# Patient Record
Sex: Male | Born: 1970
Health system: Southern US, Community
[De-identification: ages and names within clinical notes are randomized; demographics above are authoritative.]

## PROBLEM LIST (undated history)

## (undated) DIAGNOSIS — M25512 Pain in left shoulder: Secondary | ICD-10-CM

## (undated) DIAGNOSIS — M19042 Primary osteoarthritis, left hand: Secondary | ICD-10-CM

## (undated) DIAGNOSIS — J302 Other seasonal allergic rhinitis: Secondary | ICD-10-CM

## (undated) DIAGNOSIS — M7041 Prepatellar bursitis, right knee: Secondary | ICD-10-CM

## (undated) DIAGNOSIS — M25511 Pain in right shoulder: Secondary | ICD-10-CM

## (undated) DIAGNOSIS — M19041 Primary osteoarthritis, right hand: Secondary | ICD-10-CM

## (undated) DIAGNOSIS — E785 Hyperlipidemia, unspecified: Secondary | ICD-10-CM

## (undated) DIAGNOSIS — R739 Hyperglycemia, unspecified: Secondary | ICD-10-CM

## (undated) HISTORY — DX: Hyperglycemia, unspecified: R73.9

## (undated) HISTORY — PX: HEAD & NECK SKIN LESION EXCISIONAL BIOPSY: SUR472

## (undated) HISTORY — DX: Pain in right shoulder: M25.511

## (undated) HISTORY — DX: Primary osteoarthritis, left hand: M19.042

## (undated) HISTORY — DX: Pain in left shoulder: M25.512

## (undated) HISTORY — DX: Other seasonal allergic rhinitis: J30.2

## (undated) HISTORY — DX: Hyperlipidemia, unspecified: E78.5

## (undated) HISTORY — DX: Primary osteoarthritis, right hand: M19.041

## (undated) HISTORY — DX: Prepatellar bursitis, right knee: M70.41

---

## 2003-12-14 ENCOUNTER — Emergency Department (HOSPITAL_COMMUNITY): Admission: EM | Admit: 2003-12-14 | Discharge: 2003-12-14 | Payer: Self-pay | Admitting: Emergency Medicine

## 2008-05-14 ENCOUNTER — Ambulatory Visit: Payer: Self-pay | Admitting: Family Medicine

## 2008-05-14 DIAGNOSIS — J019 Acute sinusitis, unspecified: Secondary | ICD-10-CM

## 2008-05-14 DIAGNOSIS — J309 Allergic rhinitis, unspecified: Secondary | ICD-10-CM | POA: Insufficient documentation

## 2008-08-31 ENCOUNTER — Ambulatory Visit: Payer: Self-pay | Admitting: Family Medicine

## 2008-09-07 ENCOUNTER — Telehealth: Payer: Self-pay | Admitting: Family Medicine

## 2008-10-08 ENCOUNTER — Ambulatory Visit: Payer: Self-pay | Admitting: Family Medicine

## 2008-10-08 DIAGNOSIS — J209 Acute bronchitis, unspecified: Secondary | ICD-10-CM | POA: Insufficient documentation

## 2008-10-22 ENCOUNTER — Telehealth: Payer: Self-pay | Admitting: Family Medicine

## 2009-04-08 ENCOUNTER — Ambulatory Visit: Payer: Self-pay | Admitting: Family Medicine

## 2009-04-12 ENCOUNTER — Telehealth: Payer: Self-pay | Admitting: Family Medicine

## 2009-06-04 ENCOUNTER — Encounter: Payer: Self-pay | Admitting: Family Medicine

## 2010-03-08 ENCOUNTER — Ambulatory Visit: Payer: Self-pay | Admitting: Family Medicine

## 2010-03-08 DIAGNOSIS — J301 Allergic rhinitis due to pollen: Secondary | ICD-10-CM | POA: Insufficient documentation

## 2010-07-26 NOTE — Assessment & Plan Note (Signed)
Summary: allergy flare   Vital Signs:  Patient profile:   40 year old male Height:      74.25 inches Weight:      245 pounds BMI:     31.36 O2 Sat:      97 % on Room air Temp:     98.1 degrees F oral Pulse rate:   74 / minute BP sitting:   143 / 85  (left arm) Cuff size:   large  Vitals Entered By: Payton Spark CMA (March 08, 2010 10:15 AM)  O2 Flow:  Room air CC: head and nasal congestion x 1+ weeks.   Primary Care Shon Indelicato:  Seymour Bars DO  CC:  head and nasal congestion x 1+ weeks.Marland Kitchen  History of Present Illness: Michael Callahan is a 40 year-old male with a 2 week history of a clear nasal discharge that has not improved with Claritin.  The patient reports that Mucinex has made him feel like his nose is drier but he still has drainage.  He denies a headache, coughing, sore throat, fevers, chills, nausea, vomitting, ear pain, dental pain. In addition he denies any sinus pain but has felt pressure and fullness in the sinuses.  He also reports aches in his neck that usually improve with aspirin in the mornings.  He denies any other arthalagas, aches or pains.    He believes these symptoms began on a trip to [place].  late last month after consuming a beverage that has caused similar symptoms in the past.   He leaves tommorrow for a trip to [place] until the end of the month.    Current Medications (verified): 1)  Nasonex 50 Mcg/act Susp (Mometasone Furoate) .... 2 Sprays Per Nostril Once Daily  Allergies (verified): 1)  ! Pcn  Past History:  Past Medical History: Reviewed history from 05/14/2008 and no changes required. seasonal allergies  Social History: Reviewed history from 05/14/2008 and no changes required. 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 Never smoked. 1 ETOH/ wk. Runs 3 mi 3 x a wk.  Review of Systems      See HPI  Physical Exam  General:  alert, well-developed, and well-nourished.   Head:   normocephalic and atraumatic.   Eyes:  conjunctiva clear Ears:  No ear pain on the tragus or pinna.  Canals are clear and tympanic membranes are without bulging or eryhtema. Nose:  Nasal mucosa and turbinates is erthyematous and irritated Mouth:  good dentition, no erythema, and no postnasal drip.   Neck:  supple.  No lymphadnopathy Lungs:  Clear to auscultation bilaterally with no rales, rhonchi, wheezes or crackles.  No egophany noted. Heart:  normal rate, regular rhythm, no murmur, no gallop, and no rub.   Skin:  color normal.   Psych:  good eye contact, not anxious appearing, and not depressed appearing.     Impression & Recommendations:  Problem # 1:  HAY FEVER (ICD-477.0) Allergy flare either from ingestion of beer or seaon  changes, not improved with OTC anti histamines. Treat with Depo Medrol 80 mg IM x 1 today and Astelin nasal spray for the next month. Call if not improved in 1 wk.    Complete Medication List: 1)  Azelastine Hcl 0.05 % Soln (Azelastine hcl) .Marland Kitchen.. 1-2 sprays per nostril bid  Other Orders: Depo- Medrol 80mg  (J1040) Admin of Therapeutic Inj  intramuscular or subcutaneous (09811)  Patient Instructions: 1)  Steroid shot today. 2)  Start on Astelin Nasal spray 2 x a day.  Use for the next month. 3)  Call if symptoms have not resolved in 1 wk. Prescriptions: AZELASTINE HCL 0.05 % SOLN (AZELASTINE HCL) 1-2 sprays per nostril bid  #1 bottle x 2   Entered and Authorized by:   Seymour Bars DO   Signed by:   Seymour Bars DO on 03/08/2010   Method used:   Electronically to        [facility]* (retail)       [address]       [place], [place]  [address]       Ph: [phone]       Fax: [phone]   RxID:   [id number]    Medication Administration  Injection # 1:    Medication: Depo- Medrol 80mg     Diagnosis: ALLERGIC RHINITIS (ICD-477.9)    Route: IM    Site: RUOQ gluteus    Exp Date: 07/2010    Lot #: [id number]    Patient  tolerated injection without complications    Given by: Payton Spark CMA (March 08, 2010 11:32 AM)  Orders Added: 1)  Depo- Medrol 80mg  [J1040] 2)  Admin of Therapeutic Inj  intramuscular or subcutaneous [96372] 3)  Est. Patient Level III [84132]

## 2010-11-14 ENCOUNTER — Telehealth: Payer: Self-pay | Admitting: Family Medicine

## 2010-11-14 NOTE — Telephone Encounter (Signed)
Patient called request to changed providers from [facility] to [facility] request to set up an appointment with Dr.Metheney advised patient he will have to wait to sched an appt.

## 2010-11-14 NOTE — Telephone Encounter (Signed)
We have to have the pt (not his wife) request a change in physicians.

## 2010-11-14 NOTE — Telephone Encounter (Signed)
Patient's wife called to schedule patient a appt., but needs to know if he can start seeing Dr. Linford Arnold since his wife sees Dr. Linford Arnold also?? He is currently a Dr. Cathey Endow patient but he would like to have the same pcp as his wife. Is it okay to schedule this patient with Dr.Metheney instead of Dr.Bowen??? Patient is waiting on a call back at [phone]

## 2010-11-15 NOTE — Telephone Encounter (Signed)
Fine

## 2013-03-24 ENCOUNTER — Ambulatory Visit (INDEPENDENT_AMBULATORY_CARE_PROVIDER_SITE_OTHER): Payer: BC Managed Care – PPO | Admitting: Family Medicine

## 2013-03-24 ENCOUNTER — Encounter: Payer: Self-pay | Admitting: Family Medicine

## 2013-03-24 ENCOUNTER — Ambulatory Visit: Payer: Self-pay | Admitting: Physician Assistant

## 2013-03-24 VITALS — BP 123/81 | HR 57 | Ht 74.0 in | Wt 248.0 lb

## 2013-03-24 DIAGNOSIS — J309 Allergic rhinitis, unspecified: Secondary | ICD-10-CM

## 2013-03-24 DIAGNOSIS — Z23 Encounter for immunization: Secondary | ICD-10-CM

## 2013-03-24 DIAGNOSIS — J302 Other seasonal allergic rhinitis: Secondary | ICD-10-CM

## 2013-03-24 DIAGNOSIS — Z130 Encounter for screening for diseases of the blood and blood-forming organs and certain disorders involving the immune mechanism: Secondary | ICD-10-CM

## 2013-03-24 DIAGNOSIS — Z131 Encounter for screening for diabetes mellitus: Secondary | ICD-10-CM

## 2013-03-24 DIAGNOSIS — Z13220 Encounter for screening for lipoid disorders: Secondary | ICD-10-CM

## 2013-03-24 HISTORY — DX: Other seasonal allergic rhinitis: J30.2

## 2013-03-24 NOTE — Progress Notes (Signed)
CC: Michael Callahan is a 42 y.o. male is here for Establish Care   Subjective: HPI:  Very pleasant 42 year old here to establish care  Patient reports history of seasonal allergies otherwise no medical complaints. He describes symptoms of stuffy nose watery eyes that has required Claritin-D on a daily basis up until 6 months ago when he been started a supplement called mean Green  Which was also completely taking care of of the above symptoms. He stopped this a few months ago currently denies any the above symptoms nor itching, cough, wheezing, shortness of breath, nor sinus pressure nor rash. He's wondering if he should restart the medication  Patient has decided to sign up for a half marathon he can run up to 3 miles without resting without worsening shortness of breath or any chest discomfort. He currently denies any weakness nor joint discomfort. Wants to know if it's safe for him to keep training  Review of Systems - General ROS: negative for - chills, fever, night sweats, weight gain or weight loss Ophthalmic ROS: negative for - decreased vision Psychological ROS: negative for - anxiety or depression ENT ROS: negative for - hearing change, nasal congestion, tinnitus or allergies Hematological and Lymphatic ROS: negative for - bleeding problems, bruising or swollen lymph nodes Breast ROS: negative Respiratory ROS: no cough, shortness of breath, or wheezing Cardiovascular ROS: no chest pain or dyspnea on exertion Gastrointestinal ROS: no abdominal pain, change in bowel habits, or black or bloody stools Genito-Urinary ROS: negative for - genital discharge, genital ulcers, incontinence or abnormal bleeding from genitals Musculoskeletal ROS: negative for - joint pain or muscle pain Neurological ROS: negative for - headaches or memory loss Dermatological ROS: negative for lumps, mole changes, rash and skin lesion changes  Past Medical History  Diagnosis Date  . Seasonal allergies 03/24/2013      Family History  Problem Relation Age of Onset  . Alcoholism Father     grandfather  . Brain cancer      uncle     History  Substance Use Topics  . Smoking status: Never Smoker   . Smokeless tobacco: Not on file  . Alcohol Use: Yes     Comment: 3 drinks per month     Objective: Filed Vitals:   03/24/13 1437  BP: 123/81  Pulse: 57    General: Alert and Oriented, No Acute Distress HEENT: Pupils equal, round, reactive to light. Conjunctivae clear.  External ears unremarkable, canals clear with intact TMs wi moist mucous membranes are unremarkable Lungs: Clear to auscultation bilaterally, no wheezing/ronchi/rales.  Comfortable work of breathing. Good air movement. Cardiac: Regular rate and rhythm. Normal S1/S2.  No murmurs, rubs, nor gallops. No carotid bruit Extremities: No peripheral edema.  Strong peripheral pulses.  Mental Status: No depression, anxiety, nor agitation. Skin: Warm and dry.  Assessment & Plan: Michael Callahan was seen today for establish care.  Diagnoses and associated orders for this visit:  Lipid screening - Lipid panel  Diabetes mellitus screening - BASIC METABOLIC PANEL WITH GFR  Screening for deficiency anemia - CBC  Seasonal allergies  Need for prophylactic vaccination and inoculation against influenza    Seasonal allergies: Controlled I've encouraged him not to start any medication or supplements that there is no current indication He is due for cholesterol screening and diabetic screening, we'll also screen for anemia since he is starting to do strenuous exercise routine  Receive flu shot today  Return in about 1 month (around 04/23/2013) for CPE.

## 2013-03-26 ENCOUNTER — Ambulatory Visit: Payer: Self-pay | Admitting: Family Medicine

## 2013-11-25 ENCOUNTER — Ambulatory Visit (INDEPENDENT_AMBULATORY_CARE_PROVIDER_SITE_OTHER): Payer: BC Managed Care – PPO | Admitting: Family Medicine

## 2013-11-25 ENCOUNTER — Encounter: Payer: Self-pay | Admitting: Family Medicine

## 2013-11-25 VITALS — BP 150/89 | HR 88 | Wt 247.0 lb

## 2013-11-25 DIAGNOSIS — R05 Cough: Secondary | ICD-10-CM

## 2013-11-25 DIAGNOSIS — R059 Cough, unspecified: Secondary | ICD-10-CM

## 2013-11-25 MED ORDER — HYDROCODONE-HOMATROPINE 5-1.5 MG/5ML PO SYRP: 5.0000 mL | ORAL_SOLUTION | Freq: Three times a day (TID) | ORAL | Status: DC | PRN

## 2013-11-25 MED ORDER — ALBUTEROL SULFATE (2.5 MG/3ML) 0.083% IN NEBU
2.5000 mg | INHALATION_SOLUTION | Freq: Once | RESPIRATORY_TRACT | Status: AC
  Administered 2013-11-25: 2.5 mg via RESPIRATORY_TRACT

## 2013-11-25 MED ORDER — BECLOMETHASONE DIPROPIONATE 80 MCG/ACT NA AERS: INHALATION_SPRAY | NASAL | Status: DC

## 2013-11-25 MED ORDER — PREDNISONE 20 MG PO TABS: ORAL_TABLET | ORAL | Status: AC

## 2013-11-25 NOTE — Progress Notes (Signed)
CC: Michael Callahan is a 43 y.o. male is here for cough x 1 month   Subjective: HPI:  Nonproductive cough that has been present for the past month on a daily basis present all hours of the day described as persistent and moderate to severe in severity. Slightly improved with Mucinex D, nothing else makes better or worse. Symptoms began with nasal congestion, fatigue, sore throat however all the above have resolved for the past 3 weeks except for lingering cough. No improvement with Claritin. Denies shortness of breath, wheezing, fevers, chills, blood in sputum, productive cough, chest pain, facial pressure nor sore throat   Review Of Systems Outlined In HPI  Past Medical History  Diagnosis Date  . Seasonal allergies 03/24/2013    No past surgical history on file. Family History  Problem Relation Age of Onset  . Alcoholism Father     grandfather  . Brain cancer      uncle    History   Social History  . Marital Status: Legally Separated    Spouse Name: N/A    Number of Children: N/A  . Years of Education: N/A   Occupational History  . Not on file.   Social History Main Topics  . Smoking status: Never Smoker   . Smokeless tobacco: Not on file  . Alcohol Use: Yes     Comment: 3 drinks per month  . Drug Use: No  . Sexual Activity: Yes    Partners: Female   Other Topics Concern  . Not on file   Social History Narrative  . No narrative on file     Objective: BP 150/89  Pulse 88  Wt 247 lb (112.038 kg)  SpO2 97%  General: Alert and Oriented, No Acute Distress HEENT: Pupils equal, round, reactive to light. Conjunctivae clear.  External ears unremarkable, canals clear with intact TMs with appropriate landmarks.  Middle ear appears open without effusion. Pink inferior turbinates.  Moist mucous membranes, pharynx without inflammation nor lesions.  Neck supple without palpable lymphadenopathy nor abnormal masses. Lungs: Comfortable work of breathing without rhonchi nor rales.  Wheezing is present only in the initial milliseconds of his cough and localized only in the upper lung fields.  Comfortable work of breathing. Good air movement. Cardiac: Regular rate and rhythm. Normal S1/S2.  No murmurs, rubs, nor gallops.   Mental Status: No depression, anxiety, nor agitation. Skin: Warm and dry.  Assessment & Plan: [name] was seen today for cough x 1 month.  Diagnoses and associated orders for this visit:  Cough - albuterol (PROVENTIL) (2.5 MG/3ML) 0.083% nebulizer solution 2.5 mg; Take 3 mLs (2.5 mg total) by nebulization once. - predniSONE (DELTASONE) 20 MG tablet; Three tabs at once daily for five days. - HYDROcodone-homatropine (HYCODAN) 5-1.5 MG/5ML syrup; Take 5 mLs by mouth every 8 (eight) hours as needed for cough.  Other Orders - Beclomethasone Dipropionate (QNASL) 80 MCG/ACT AERS; Two sprays each nostril daily.    Albuterol nebulizer was provided in the office to determine if there was any reactive airways disease component to his cough, albuterol did not make symptoms better or worse. I suspect that his cough is most likely due to postnasal drip therefore start prednisone taper, and begin qnasl, sample was provided. Hycodan only for sleep to help with cough  25 minutes spent face-to-face during visit today of which at least 50% was counseling or coordinating care regarding: 1. Cough       Return if symptoms worsen or fail to improve.

## 2014-02-09 ENCOUNTER — Encounter: Payer: Self-pay | Admitting: Family Medicine

## 2014-02-09 ENCOUNTER — Ambulatory Visit (INDEPENDENT_AMBULATORY_CARE_PROVIDER_SITE_OTHER): Payer: BC Managed Care – PPO | Admitting: Family Medicine

## 2014-02-09 VITALS — BP 128/78 | HR 76 | Temp 98.6°F | Wt 242.0 lb

## 2014-02-09 DIAGNOSIS — H60399 Other infective otitis externa, unspecified ear: Secondary | ICD-10-CM | POA: Diagnosis not present

## 2014-02-09 DIAGNOSIS — H6091 Unspecified otitis externa, right ear: Secondary | ICD-10-CM

## 2014-02-09 MED ORDER — NEOMYCIN-POLYMYXIN-HC 3.5-10000-1 OT SOLN: 3.0000 [drp] | Freq: Four times a day (QID) | OTIC | Status: DC

## 2014-02-09 NOTE — Progress Notes (Signed)
CC: Michael Callahan is a 43 y.o. male is here for right ear pain   Subjective: HPI:  Complains of right ear pain described only as pain, mild in severity, localized to the canal of the ear. He has been bothered by this for 2 weeks, it slightly improves when using Ciprodex, it's worse with relation of the ear or canal of the ear. He reports a pressure sensation within the ear but denies hearing loss, tinnitus, nor roaring in the ear. Denies motor or sensory disturbances. No other interventions as of yet. Denies fevers, chills, discharge from the ear, nasal congestion, dizziness or confusion   Review Of Systems Outlined In HPI  Past Medical History  Diagnosis Date  . Seasonal allergies 03/24/2013    No past surgical history on file. Family History  Problem Relation Age of Onset  . Alcoholism Father     grandfather  . Brain cancer      uncle    History   Social History  . Marital Status: Legally Separated    Spouse Name: N/A    Number of Children: N/A  . Years of Education: N/A   Occupational History  . Not on file.   Social History Main Topics  . Smoking status: Never Smoker   . Smokeless tobacco: Not on file  . Alcohol Use: Yes     Comment: 3 drinks per month  . Drug Use: No  . Sexual Activity: Yes    Partners: Female   Other Topics Concern  . Not on file   Social History Narrative  . No narrative on file     Objective: BP 128/78  Pulse 76  Temp(Src) 98.6 F (37 C) (Oral)  Wt 242 lb (109.77 kg)  General: Alert and Oriented, No Acute Distress HEENT: Pupils equal, round, reactive to light. Conjunctivae clear.  External ears unremarkable other than mild erythema and edema in the proximal right ear canal, intact TMs with appropriate landmarks.  Middle ear appears open without effusion. Pink inferior turbinates.  Moist mucous membranes, pharynx without inflammation nor lesions.  Neck supple without palpable lymphadenopathy nor abnormal masses. Neuro: Rinne and Weber  test normal Extremities: No peripheral edema.  Strong peripheral pulses.  Mental Status: No depression, anxiety, nor agitation. Skin: Warm and dry.  Assessment & Plan: Michael Callahan was seen today for right ear pain.  Diagnoses and associated orders for this visit:  Right otitis externa - neomycin-polymyxin-hydrocortisone (CORTISPORIN) otic solution; Place 3 drops into the right ear 4 (four) times daily. For one week.    Right otitis externa: Start Cortisporin stop Ciprodex, if no improvement after 3 days feel free to call me and I will call in prednisone for any component of eustachian tube dysfunction causing a pressure station in his ear   Return if symptoms worsen or fail to improve.

## 2014-07-20 ENCOUNTER — Ambulatory Visit (INDEPENDENT_AMBULATORY_CARE_PROVIDER_SITE_OTHER): Payer: BLUE CROSS/BLUE SHIELD | Admitting: Family Medicine

## 2014-07-20 ENCOUNTER — Encounter: Payer: Self-pay | Admitting: Family Medicine

## 2014-07-20 VITALS — BP 153/97 | HR 77 | Temp 97.6°F | Wt 251.0 lb

## 2014-07-20 DIAGNOSIS — A499 Bacterial infection, unspecified: Secondary | ICD-10-CM | POA: Diagnosis not present

## 2014-07-20 DIAGNOSIS — J329 Chronic sinusitis, unspecified: Secondary | ICD-10-CM | POA: Diagnosis not present

## 2014-07-20 DIAGNOSIS — B9689 Other specified bacterial agents as the cause of diseases classified elsewhere: Secondary | ICD-10-CM

## 2014-07-20 MED ORDER — HYDROCODONE-HOMATROPINE 5-1.5 MG/5ML PO SYRP: 5.0000 mL | ORAL_SOLUTION | Freq: Every evening | ORAL | Status: DC | PRN

## 2014-07-20 MED ORDER — DOXYCYCLINE HYCLATE 100 MG PO TABS: ORAL_TABLET | ORAL | Status: AC

## 2014-07-20 NOTE — Progress Notes (Signed)
CC: Michael Callahan is a 44 y.o. male is here for Nasal Congestion and Cough   Subjective: HPI:   nonproductive cough, nasal congestion, postnasal drip facial pressure in the forehead that has been present for a month that has been waxing and waning, weekly basis. He believes it's plateaued over the past 2-3 days. No benefit from Mucinex products. No other interventions as of yet. Joined by fatigue. Symptoms are worse when lying down at night, significantly interfering with sleep now. Denies fevers, chills, wheezing, chest pain, shortness of breath, nausea or vomiting   Review Of Systems Outlined In HPI  Past Medical History  Diagnosis Date  . Seasonal allergies 03/24/2013    No past surgical history on file. Family History  Problem Relation Age of Onset  . Alcoholism Father     grandfather  . Brain cancer      uncle    History   Social History  . Marital Status: Legally Separated    Spouse Name: N/A    Number of Children: N/A  . Years of Education: N/A   Occupational History  . Not on file.   Social History Main Topics  . Smoking status: Never Smoker   . Smokeless tobacco: Not on file  . Alcohol Use: Yes     Comment: 3 drinks per month  . Drug Use: No  . Sexual Activity:    Partners: Female   Other Topics Concern  . Not on file   Social History Narrative     Objective: BP 153/97 mmHg  Pulse 77  Temp(Src) 97.6 F (36.4 C) (Oral)  Wt 251 lb (113.853 kg)  General: Alert and Oriented, No Acute Distress HEENT: Pupils equal, round, reactive to light. Conjunctivae clear.  External ears unremarkable, canals clear with intact TMs with appropriate landmarks.  Middle ear appears open without effusion. Pink inferior turbinates.  Moist mucous membranes, pharynx without inflammation nor lesions however moderate cobblestoning and postnasal drip.  Neck supple without palpable lymphadenopathy nor abnormal masses. Lungs: Clear to auscultation bilaterally, no  wheezing/ronchi/rales.  Comfortable work of breathing. Good air movement. Extremities: No peripheral edema.  Strong peripheral pulses.  Mental Status: No depression, anxiety, nor agitation. Skin: Warm and dry.  Assessment & Plan: [name] was seen today for nasal congestion and cough.  Diagnoses and associated orders for this visit:  Bacterial sinusitis - doxycycline (VIBRA-TABS) 100 MG tablet; One by mouth twice a day for ten days. - HYDROcodone-homatropine (HYCODAN) 5-1.5 MG/5ML syrup; Take 5 mLs by mouth at bedtime as needed for cough.    Bacterial sinusitis: Start doxycycline consider nasal saline washes. Provided with Hycodan to help with sleep, sedation warning provided.   Return if symptoms worsen or fail to improve.

## 2014-08-17 ENCOUNTER — Ambulatory Visit (INDEPENDENT_AMBULATORY_CARE_PROVIDER_SITE_OTHER): Payer: BLUE CROSS/BLUE SHIELD | Admitting: Family Medicine

## 2014-08-17 ENCOUNTER — Encounter: Payer: Self-pay | Admitting: Family Medicine

## 2014-08-17 VITALS — BP 153/93 | HR 64 | Temp 97.8°F | Wt 246.0 lb

## 2014-08-17 DIAGNOSIS — B9689 Other specified bacterial agents as the cause of diseases classified elsewhere: Secondary | ICD-10-CM

## 2014-08-17 DIAGNOSIS — A499 Bacterial infection, unspecified: Secondary | ICD-10-CM

## 2014-08-17 DIAGNOSIS — J329 Chronic sinusitis, unspecified: Secondary | ICD-10-CM | POA: Diagnosis not present

## 2014-08-17 MED ORDER — LEVOFLOXACIN 500 MG PO TABS: 500.0000 mg | ORAL_TABLET | Freq: Every day | ORAL | Status: DC

## 2014-08-17 MED ORDER — PREDNISONE 20 MG PO TABS: ORAL_TABLET | ORAL | Status: AC

## 2014-08-17 NOTE — Progress Notes (Signed)
CC: Michael Callahan is a 44 y.o. male is here for Nasal Congestion and Cough   Subjective: HPI:  Facial pressure localized in the forehead described as constant pressure with thick nasal congestion and postnasal drip. Productive cough in the morning but dry for the rest of the day. No benefit from pseudoephedrine or Mucinex. Mild benefit from Nettie pot. Symptoms are persistent throughout the day and are moderate in severity. Other than above nothing seems to make better or worse. Similar to symptoms that he was expecting back in January which was improved to a mild degree on doxycycline only to worsen 3 days after stopping this medication. He reports body aches but denies fevers chills shortness of breath or chest discomfort. No motor or sensory disturbances other than that described above   Review Of Systems Outlined In HPI  Past Medical History  Diagnosis Date  . Seasonal allergies 03/24/2013    No past surgical history on file. Family History  Problem Relation Age of Onset  . Alcoholism Father     grandfather  . Brain cancer      uncle    History   Social History  . Marital Status: Legally Separated    Spouse Name: N/A  . Number of Children: N/A  . Years of Education: N/A   Occupational History  . Not on file.   Social History Main Topics  . Smoking status: Never Smoker   . Smokeless tobacco: Not on file  . Alcohol Use: Yes     Comment: 3 drinks per month  . Drug Use: No  . Sexual Activity:    Partners: Female   Other Topics Concern  . Not on file   Social History Narrative     Objective: BP 153/93 mmHg  Pulse 64  Temp(Src) 97.8 F (36.6 C) (Oral)  Wt 246 lb (111.585 kg)  General: Alert and Oriented, No Acute Distress HEENT: Pupils equal, round, reactive to light. Conjunctivae clear.  External ears unremarkable, canals clear with intact TMs with appropriate landmarks.  Middle ear appears open without effusion. Boggy erythematous inferior turbinates with  moderate mucoid discharge.  Moist mucous membranes, pharynx without inflammation nor lesions.  Neck supple without palpable lymphadenopathy nor abnormal masses. Lungs: Clear to auscultation bilaterally, no wheezing/ronchi/rales.  Comfortable work of breathing. Good air movement. Cardiac: Regular rate and rhythm. Normal S1/S2.  No murmurs, rubs, nor gallops.   Extremities: No peripheral edema.  Strong peripheral pulses.  Mental Status: No depression, anxiety, nor agitation. Skin: Warm and dry.  Assessment & Plan: Michael Callahan was seen today for nasal congestion and cough.  Diagnoses and all orders for this visit:  Bacterial sinusitis Orders: -     predniSONE (DELTASONE) 20 MG tablet; Three tabs at once daily for five days. -     levofloxacin (LEVAQUIN) 500 MG tablet; Take 1 tablet (500 mg total) by mouth daily.   Bacterial sinusitis: Start prednisone burst and Levaquin. If no better by Thursday please call me and the next step will be a CT scan of the sinuses.  Return if symptoms worsen or fail to improve.

## 2014-10-19 ENCOUNTER — Encounter: Payer: BLUE CROSS/BLUE SHIELD | Admitting: Family Medicine

## 2014-10-26 ENCOUNTER — Encounter: Payer: Self-pay | Admitting: Family Medicine

## 2014-10-26 ENCOUNTER — Ambulatory Visit (INDEPENDENT_AMBULATORY_CARE_PROVIDER_SITE_OTHER): Payer: BLUE CROSS/BLUE SHIELD | Admitting: Family Medicine

## 2014-10-26 VITALS — BP 141/85 | HR 84 | Ht 74.0 in | Wt 246.0 lb

## 2014-10-26 DIAGNOSIS — Z0000 Encounter for general adult medical examination without abnormal findings: Secondary | ICD-10-CM | POA: Diagnosis not present

## 2014-10-26 DIAGNOSIS — Z23 Encounter for immunization: Secondary | ICD-10-CM | POA: Diagnosis not present

## 2014-10-26 NOTE — Progress Notes (Signed)
CC: Michael Callahan is a 44 y.o. male is here for Annual Exam   Subjective: HPI:  Colonoscopy: No current indication Prostate: Discussed screening risks/beneifts with patient today, no current indication Influenza Vaccine: Up-to-date Pneumovax: No current indication Td/Tdap: Overdue will need booster today Zoster: (Start 46 yo)  Presents for complete physical exam with no acute complaints.   Review of Systems - General ROS: negative for - chills, fever, night sweats, weight gain or weight loss Ophthalmic ROS: negative for - decreased vision Psychological ROS: negative for - anxiety or depression ENT ROS: negative for - hearing change, nasal congestion, tinnitus or allergies Hematological and Lymphatic ROS: negative for - bleeding problems, bruising or swollen lymph nodes Breast ROS: negative Respiratory ROS: no cough, shortness of breath, or wheezing Cardiovascular ROS: no chest pain or dyspnea on exertion Gastrointestinal ROS: no abdominal pain, change in bowel habits, or black or bloody stools Genito-Urinary ROS: negative for - genital discharge, genital ulcers, incontinence or abnormal bleeding from genitals Musculoskeletal ROS: negative for - joint pain or muscle pain Neurological ROS: negative for - headaches or memory loss Dermatological ROS: negative for lumps, mole changes, rash and skin lesion changes  Past Medical History  Diagnosis Date  . Seasonal allergies 03/24/2013    No past surgical history on file. Family History  Problem Relation Age of Onset  . Alcoholism Father     grandfather  . Brain cancer      uncle    History   Social History  . Marital Status: Legally Separated    Spouse Name: N/A  . Number of Children: N/A  . Years of Education: N/A   Occupational History  . Not on file.   Social History Main Topics  . Smoking status: Never Smoker   . Smokeless tobacco: Not on file  . Alcohol Use: Yes     Comment: 3 drinks per month  . Drug Use: No  .  Sexual Activity:    Partners: Female   Other Topics Concern  . Not on file   Social History Narrative     Objective: BP 141/85 mmHg  Pulse 84  Ht  (1.88 m)  Wt 246 lb (111.585 kg)  BMI 31.57 kg/m2  General: No Acute Distress HEENT: Atraumatic, normocephalic, conjunctivae normal without scleral icterus.  No nasal discharge, hearing grossly intact, TMs with good landmarks bilaterally with no middle ear abnormalities, posterior pharynx clear without oral lesions. Neck: Supple, trachea midline, no cervical nor supraclavicular adenopathy. Pulmonary: Clear to auscultation bilaterally without wheezing, rhonchi, nor rales. Cardiac: Regular rate and rhythm.  No murmurs, rubs, nor gallops. No peripheral edema.  2+ peripheral pulses bilaterally. Abdomen: Bowel sounds normal.  No masses.  Non-tender without rebound.  Negative Murphy's sign. MSK: Grossly intact, no signs of weakness.  Full strength throughout upper and lower extremities.  Full ROM in upper and lower extremities.  No midline spinal tenderness. Neuro: Gait unremarkable, CN II-XII grossly intact.  C5-C6 Reflex 2/4 Bilaterally, L4 Reflex 2/4 Bilaterally.  Cerebellar function intact. Skin: No rashes. Psych: Alert and oriented to person/place/time.  Thought process normal. No anxiety/depression.  Assessment & Plan: Michael Callahan was seen today for annual exam.  Diagnoses and all orders for this visit:  Annual physical exam Orders: -     Lipid panel -     COMPLETE METABOLIC PANEL WITH GFR -     CBC -     Tdap vaccine greater than or equal to 7yo IM   Healthy lifestyle interventions  including but not limited to regular exercise, a healthy low fat diet, moderation of salt intake, the dangers of tobacco/alcohol/recreational drug use, nutrition supplementation, and accident avoidance were discussed with the patient and a handout was provided for future reference.  Discussed sodium or stretching to help with blood pressure. He tells me  that he's been enjoying large volumes of salted nuts and seeds over the weekend. It sounds that there is a lot of room for improvement. I've asked him to follow-up with me in one month to go over blood pressure changes. Discussed DASH diet and sodium restriction  Return in about 1 month (around 11/26/2014) for Blood Pressure Review.

## 2014-10-26 NOTE — Patient Instructions (Signed)
DASH Eating Plan °DASH stands for "Dietary Approaches to Stop Hypertension." The DASH eating plan is a healthy eating plan that has been shown to reduce high blood pressure (hypertension). Additional health benefits may include reducing the risk of type 2 diabetes mellitus, heart disease, and stroke. The DASH eating plan may also help with weight loss. °WHAT DO I NEED TO KNOW ABOUT THE DASH EATING PLAN? °For the DASH eating plan, you will follow these general guidelines: °· Choose foods with a percent daily value for sodium of less than 5% (as listed on the food label). °· Use salt-free seasonings or herbs instead of table salt or sea salt. °· Check with your health care provider or pharmacist before using salt substitutes. °· Eat lower-sodium products, often labeled as "lower sodium" or "no salt added." °· Eat fresh foods. °· Eat more vegetables, fruits, and low-fat dairy products. °· Choose whole grains. Look for the word "whole" as the first word in the ingredient list. °· Choose fish and skinless chicken or turkey more often than red meat. Limit fish, poultry, and meat to 6 oz (170 g) each day. °· Limit sweets, desserts, sugars, and sugary drinks. °· Choose heart-healthy fats. °· Limit cheese to 1 oz (28 g) per day. °· Eat more home-cooked food and less restaurant, buffet, and fast food. °· Limit fried foods. °· Cook foods using methods other than frying. °· Limit canned vegetables. If you do use them, rinse them well to decrease the sodium. °· When eating at a restaurant, ask that your food be prepared with less salt, or no salt if possible. °WHAT FOODS CAN I EAT? °Seek help from a dietitian for individual calorie needs. °Grains °Whole grain or whole wheat bread. Brown rice. Whole grain or whole wheat pasta. Quinoa, bulgur, and whole grain cereals. Low-sodium cereals. Corn or whole wheat flour tortillas. Whole grain cornbread. Whole grain crackers. Low-sodium crackers. °Vegetables °Fresh or frozen vegetables  (raw, steamed, roasted, or grilled). Low-sodium or reduced-sodium tomato and vegetable juices. Low-sodium or reduced-sodium tomato sauce and paste. Low-sodium or reduced-sodium canned vegetables.  °Fruits °All fresh, canned (in natural juice), or frozen fruits. °Meat and Other Protein Products °Ground beef (85% or leaner), grass-fed beef, or beef trimmed of fat. Skinless chicken or turkey. Ground chicken or turkey. Pork trimmed of fat. All fish and seafood. Eggs. Dried beans, peas, or lentils. Unsalted nuts and seeds. Unsalted canned beans. °Dairy °Low-fat dairy products, such as skim or 1% milk, 2% or reduced-fat cheeses, low-fat ricotta or cottage cheese, or plain low-fat yogurt. Low-sodium or reduced-sodium cheeses. °Fats and Oils °Tub margarines without trans fats. Light or reduced-fat mayonnaise and salad dressings (reduced sodium). Avocado. Safflower, olive, or canola oils. Natural peanut or almond butter. °Other °Unsalted popcorn and pretzels. °The items listed above may not be a complete list of recommended foods or beverages. Contact your dietitian for more options. °WHAT FOODS ARE NOT RECOMMENDED? °Grains °White bread. White pasta. White rice. Refined cornbread. Bagels and croissants. Crackers that contain trans fat. °Vegetables °Creamed or fried vegetables. Vegetables in a cheese sauce. Regular canned vegetables. Regular canned tomato sauce and paste. Regular tomato and vegetable juices. °Fruits °Dried fruits. Canned fruit in light or heavy syrup. Fruit juice. °Meat and Other Protein Products °Fatty cuts of meat. Ribs, chicken wings, bacon, sausage, bologna, salami, chitterlings, fatback, hot dogs, bratwurst, and packaged luncheon meats. Salted nuts and seeds. Canned beans with salt. °Dairy °Whole or 2% milk, cream, half-and-half, and cream cheese. Whole-fat or sweetened yogurt. Full-fat   cheeses or blue cheese. Nondairy creamers and whipped toppings. Processed cheese, cheese spreads, or cheese  curds. °Condiments °Onion and garlic salt, seasoned salt, table salt, and sea salt. Canned and packaged gravies. Worcestershire sauce. Tartar sauce. Barbecue sauce. Teriyaki sauce. Soy sauce, including reduced sodium. Steak sauce. Fish sauce. Oyster sauce. Cocktail sauce. Horseradish. Ketchup and mustard. Meat flavorings and tenderizers. Bouillon cubes. Hot sauce. Tabasco sauce. Marinades. Taco seasonings. Relishes. °Fats and Oils °Butter, stick margarine, lard, shortening, ghee, and bacon fat. Coconut, palm kernel, or palm oils. Regular salad dressings. °Other °Pickles and olives. Salted popcorn and pretzels. °The items listed above may not be a complete list of foods and beverages to avoid. Contact your dietitian for more information. °WHERE CAN I FIND MORE INFORMATION? °National Heart, Lung, and [employer] Institute: www.nhlbi.nih.gov/health/health-topics/topics/dash/ °Document Released: 06/01/2011 Document Revised: 10/27/2013 Document Reviewed: 04/16/2013 °ExitCare® Patient Information ©2015 [employer]. This information is not intended to replace advice given to you by your health care provider. Make sure you discuss any questions you have with your health care provider. ° °

## 2014-12-01 LAB — CBC
HCT: 44.4 % (ref 39.0–52.0)
HEMOGLOBIN: 15.6 g/dL (ref 13.0–17.0)
MCH: 33.3 pg (ref 26.0–34.0)
MCHC: 35.1 g/dL (ref 30.0–36.0)
MCV: 94.7 fL (ref 78.0–100.0)
MPV: 11.1 fL (ref 8.6–12.4)
Platelets: 228 10*3/uL (ref 150–400)
RBC: 4.69 MIL/uL (ref 4.22–5.81)
RDW: 13.3 % (ref 11.5–15.5)
WBC: 5.2 10*3/uL (ref 4.0–10.5)

## 2014-12-02 ENCOUNTER — Telehealth: Payer: Self-pay | Admitting: Family Medicine

## 2014-12-02 DIAGNOSIS — R739 Hyperglycemia, unspecified: Secondary | ICD-10-CM | POA: Insufficient documentation

## 2014-12-02 HISTORY — DX: Hyperglycemia, unspecified: R73.9

## 2014-12-02 LAB — HEMOGLOBIN A1C
Hgb A1c MFr Bld: 5.2 % (ref <5.7)
MEAN PLASMA GLUCOSE: 103 mg/dL (ref <117)

## 2014-12-02 LAB — COMPLETE METABOLIC PANEL WITH GFR
ALBUMIN: 4.4 g/dL (ref 3.5–5.2)
ALT: 17 U/L (ref 0–53)
AST: 18 U/L (ref 0–37)
Alkaline Phosphatase: 49 U/L (ref 39–117)
BUN: 14 mg/dL (ref 6–23)
CHLORIDE: 105 meq/L (ref 96–112)
CO2: 27 mEq/L (ref 19–32)
CREATININE: 0.96 mg/dL (ref 0.50–1.35)
Calcium: 9.3 mg/dL (ref 8.4–10.5)
GFR, Est Non African American: >89 mL/min
GLUCOSE: 102 mg/dL — AB (ref 70–99)
Potassium: 4.6 mEq/L (ref 3.5–5.3)
SODIUM: 140 meq/L (ref 135–145)
TOTAL PROTEIN: 6.7 g/dL (ref 6.0–8.3)
Total Bilirubin: 0.7 mg/dL (ref 0.2–1.2)

## 2014-12-02 LAB — LIPID PANEL
CHOL/HDL RATIO: 3.9 ratio
Cholesterol: 185 mg/dL (ref 0–200)
HDL: 47 mg/dL (ref >=40)
LDL CALC: 112 mg/dL — AB (ref 0–99)
Triglycerides: 129 mg/dL (ref <150)
VLDL: 26 mg/dL (ref 0–40)

## 2014-12-02 NOTE — Telephone Encounter (Signed)
Labs added on and left detailed message on vm

## 2014-12-02 NOTE — Telephone Encounter (Signed)
Sue Lushndrea, Will you please let patient know that his LDL cholesterol was mildly elevated but not to a degree that warrants cholesterol lowering medication given his lack of cardiac risk factors.  This may change as he gets older, This can be improved with engaging in 30-45 minutes of moderate exercise most days of the week.  Also his blood sugar was mildly elevated so I'd recommend he have an A1c checked, I'll print off a lab slip but can you also ask the lab if they can do this as an add-on?  Liver function, kidney function, and blood cell counts were all normal.  I've completed the physician section of his biometric form, ready for pickup and in your inbox.

## 2015-01-18 ENCOUNTER — Encounter: Payer: Self-pay | Admitting: Family Medicine

## 2015-01-18 ENCOUNTER — Ambulatory Visit (INDEPENDENT_AMBULATORY_CARE_PROVIDER_SITE_OTHER): Payer: BLUE CROSS/BLUE SHIELD | Admitting: Family Medicine

## 2015-01-18 VITALS — BP 146/80 | HR 80 | Wt 245.0 lb

## 2015-01-18 DIAGNOSIS — H60393 Other infective otitis externa, bilateral: Secondary | ICD-10-CM

## 2015-01-18 DIAGNOSIS — L237 Allergic contact dermatitis due to plants, except food: Secondary | ICD-10-CM

## 2015-01-18 MED ORDER — CIPROFLOXACIN-HYDROCORTISONE 0.2-1 % OT SUSP: 3.0000 [drp] | Freq: Two times a day (BID) | OTIC | Status: DC

## 2015-01-18 MED ORDER — PREDNISONE 20 MG PO TABS
ORAL_TABLET | ORAL | Status: DC
Start: 2015-01-18 — End: 2016-01-13

## 2015-01-18 NOTE — Patient Instructions (Signed)
Poison [employer] ivy is a inflammation of the skin (contact dermatitis) caused by touching the allergens on the leaves of the ivy plant following previous exposure to the plant. The rash usually appears 48 hours after exposure. The rash is usually bumps (papules) or blisters (vesicles) in a linear pattern. Depending on your own sensitivity, the rash may simply cause redness and itching, or it may also progress to blisters which may break open. These must be well cared for to prevent secondary bacterial (germ) infection, followed by scarring. Keep any open areas dry, clean, dressed, and covered with an antibacterial ointment if needed. The eyes may also get puffy. The puffiness is worst in the morning and gets better as the day progresses. This dermatitis usually heals without scarring, within 2 to 3 weeks without treatment. HOME CARE INSTRUCTIONS  Thoroughly wash with soap and water as soon as you have been exposed to poison ivy. You have about one half hour to remove the plant resin before it will cause the rash. This washing will destroy the oil or antigen on the skin that is causing, or will cause, the rash. Be sure to wash under your fingernails as any plant resin there will continue to spread the rash. Do not rub skin vigorously when washing affected area. Poison ivy cannot spread if no oil from the plant remains on your body. A rash that has progressed to weeping sores will not spread the rash unless you have not washed thoroughly. It is also important to wash any clothes you have been wearing as these may carry active allergens. The rash will return if you wear the unwashed clothing, even several days later. Avoidance of the plant in the future is the best measure. Poison ivy plant can be recognized by the number of leaves. Generally, poison ivy has three leaves with flowering branches on a single stem. Diphenhydramine may be purchased over the counter and used as needed for itching. Do not drive with  this medication if it makes you drowsy.Ask your caregiver about medication for children. SEEK MEDICAL CARE IF:  Open sores develop.  Redness spreads beyond area of rash.  You notice purulent (pus-like) discharge.  You have increased pain.  Other signs of infection develop (such as fever). Document Released: 06/09/2000 Document Revised: 09/04/2011 Document Reviewed: 11/20/2008 [facility] Patient Information 2015 [place], [place]. This information is not intended to replace advice given to you by your health care provider. Make sure you discuss any questions you have with your health care provider. Otitis Externa Otitis externa is a bacterial or fungal infection of the outer ear canal. This is the area from the eardrum to the outside of the ear. Otitis externa is sometimes called "swimmer's ear." CAUSES  Possible causes of infection include:  Swimming in dirty water.  Moisture remaining in the ear after swimming or bathing.  Mild injury (trauma) to the ear.  Objects stuck in the ear (foreign body).  Cuts or scrapes (abrasions) on the outside of the ear. SIGNS AND SYMPTOMS  The first symptom of infection is often itching in the ear canal. Later signs and symptoms may include swelling and redness of the ear canal, ear pain, and yellowish-white fluid (pus) coming from the ear. The ear pain may be worse when pulling on the earlobe. DIAGNOSIS  Your health care provider will perform a physical exam. A sample of fluid may be taken from the ear and examined for bacteria or fungi. TREATMENT  Antibiotic ear drops are often given for  10 to 14 days. Treatment may also include pain medicine or corticosteroids to reduce itching and swelling. HOME CARE INSTRUCTIONS   Apply antibiotic ear drops to the ear canal as prescribed by your health care provider.  Take medicines only as directed by your health care provider.  If you have diabetes, follow any additional treatment instructions from your  health care provider.  Keep all follow-up visits as directed by your health care provider. PREVENTION   Keep your ear dry. Use the corner of a towel to absorb water out of the ear canal after swimming or bathing.  Avoid scratching or putting objects inside your ear. This can damage the ear canal or remove the protective wax that lines the canal. This makes it easier for bacteria and fungi to grow.  Avoid swimming in lakes, polluted water, or poorly chlorinated pools.  You may use ear drops made of rubbing alcohol and vinegar after swimming. Combine equal parts of white vinegar and alcohol in a bottle. Put 3 or 4 drops into each ear after swimming. SEEK MEDICAL CARE IF:   You have a fever.  Your ear is still red, swollen, painful, or draining pus after 3 days.  Your redness, swelling, or pain gets worse.  You have a severe headache.  You have redness, swelling, pain, or tenderness in the area behind your ear. MAKE SURE YOU:   Understand these instructions.  Will watch your condition.  Will get help right away if you are not doing well or get worse. Document Released: 06/12/2005 Document Revised: 10/27/2013 Document Reviewed: 06/29/2011 [facility] Patient Information 2015 [place], [place]. This information is not intended to replace advice given to you by your health care provider. Make sure you discuss any questions you have with your health care provider.

## 2015-01-18 NOTE — Progress Notes (Signed)
   Subjective:    Patient ID: Michael Callahan, male    DOB: October 13, 1970, 44 y.o.   MRN: [id number]  HPI Poison [place] thinks that he came in contact with this about 4 days ago. its on the inside of his L arm bicep area, finger tips and inside of R thigh. He feels like the rash that's in the right inner thigh is actually spreading towards his crotch area.  Left ear pain x 2 weeks.  Does swim a lot. Constant pressure and sore. No drainage.  No fever, chills.   Review of Systems     Objective:   Physical Exam  Constitutional: He is oriented to person, place, and time. He appears well-developed and well-nourished.  HENT:  Head: Normocephalic and atraumatic.  Right Ear: External ear normal.  Left Ear: External ear normal.  Nose: Nose normal.  There is some white debris scattered over both inner ear canals. Tympanic membranes look clear with no fluid or retraction.  Eyes: Conjunctivae and EOM are normal. Pupils are equal, round, and reactive to light.  Neurological: He is alert and oriented to person, place, and time.  Skin: Skin is warm. Rash noted.  Erythematous raised linear straight, left inner arm and several on the right inner thigh and a pink erythematous nodule on the right middle finger on the dorsum of the distal finger.  Psychiatric: He has a normal mood and affect. His behavior is normal.          Assessment & Plan:  Poison [place] - okay to use over-the-counter topical calamine lotion for soothing as well as several baths. Will do an oral prednisone course for 10 days.  Otitis externa bilateral-the protein drops prescribed. Patient can call if they're too expensive. Avoid over cleaning and remitting to metastases wax as this does make you more prone to swimmer's ear.

## 2015-12-22 ENCOUNTER — Encounter: Payer: BLUE CROSS/BLUE SHIELD | Admitting: Family Medicine

## 2016-01-13 ENCOUNTER — Ambulatory Visit (INDEPENDENT_AMBULATORY_CARE_PROVIDER_SITE_OTHER): Payer: Managed Care, Other (non HMO) | Admitting: Family Medicine

## 2016-01-13 ENCOUNTER — Encounter: Payer: Self-pay | Admitting: Family Medicine

## 2016-01-13 VITALS — BP 130/82 | HR 66 | Wt 240.0 lb

## 2016-01-13 DIAGNOSIS — L570 Actinic keratosis: Secondary | ICD-10-CM

## 2016-01-13 DIAGNOSIS — R3589 Other polyuria: Secondary | ICD-10-CM

## 2016-01-13 DIAGNOSIS — R358 Other polyuria: Secondary | ICD-10-CM

## 2016-01-13 DIAGNOSIS — H60393 Other infective otitis externa, bilateral: Secondary | ICD-10-CM | POA: Diagnosis not present

## 2016-01-13 MED ORDER — NEOMYCIN-POLYMYXIN-HC 1 % OT SOLN: OTIC | Status: AC

## 2016-01-13 NOTE — Progress Notes (Signed)
CC: Michael Callahan is a 45 y.o. male is here for Ear Pain   Subjective: HPI:  The past week he's felt pain and itching in both ear canals. No benefit from leftover Cipro eardrops that he got last year. Symptoms got worse last night. He denies any hearing loss or drainage from either ear. He denies any upper respiratory complaints. No fevers chills or headache  For the past year he's noticed that he urinates more than the average individual. He will wake up anywhere between 2 and 4 times a night to urinate. When traveling he notices that he urinates every 1-2 hours while awake. She denies any dysuria or sensation of incomplete voiding.  He's had a spot on his forehead that he wants to know if I can treat with cryotherapy. He usually goes to his dermatologist but for convenience once enough he can have it done here. It's flaky, he can scratch it off but will return in a few weeks. It's painless. He denies skin changes elsewhere.   Review Of Systems Outlined In HPI  Past Medical History  Diagnosis Date  . Seasonal allergies 03/24/2013    No past surgical history on file. Family History  Problem Relation Age of Onset  . Alcoholism Father     grandfather  . Brain cancer      uncle    Social History   Social History  . Marital Status: Legally Separated    Spouse Name: N/A  . Number of Children: N/A  . Years of Education: N/A   Occupational History  . Not on file.   Social History Main Topics  . Smoking status: Never Smoker   . Smokeless tobacco: Not on file  . Alcohol Use: Yes     Comment: 3 drinks per month  . Drug Use: No  . Sexual Activity:    Partners: Female   Other Topics Concern  . Not on file   Social History Narrative     Objective: BP 130/82 mmHg  Pulse 66  Wt 240 lb (108.863 kg)  General: Alert and Oriented, No Acute Distress HEENT: Pupils equal, round, reactive to light. Conjunctivae clear.  External ears unremarkable, canals With mild erythema and edema  with intact TMs with appropriate landmarks.  Middle ear appears open without effusion. Pink inferior turbinates.  Moist mucous membranes, pharynx without inflammation nor lesions.  Neck supple without palpable lymphadenopathy nor abnormal masses. Lungs: Clear comfortable work of breathing Cardiac: Regular rate and rhythm. Extremities: No peripheral edema.  Strong peripheral pulses.  Mental Status: No depression, anxiety, nor agitation. Skin: Warm and dry. Scale appearing to be actinic keratosis on the right forehead  Assessment & Plan: Michael Callahan was seen today for ear pain.  Diagnoses and all orders for this visit:  Otitis, externa, infective, bilateral -     NEOMYCIN-POLYMYXIN-HYDROCORTISONE (CORTISPORIN) 1 % SOLN otic solution; Four drops in affected ear(s) three times a day, keep in ear(s) for five minutes. Total of ten days.  Polyuria -     PSA -     Hemoglobin A1c  Actinic keratoses   Start Cortisporin for bilateral swimmer's ear Polyuria: Checking a PSA and A1c if both are normal will treat with Flomax Actinic keratosis: Cryotherapy was performed.  Cryotherapy Procedure Note  Pre-operative Diagnosis: Actinic keratosis  Post-operative Diagnosis: Actinic keratosis  Locations: right forehead  Indications: premalignant  Anesthesia: none  Procedure Details  History of allergy to iodine: no. Pacemaker? no.  Patient informed of risks (permanent scarring, infection,  light or dark discoloration, bleeding, infection, weakness, numbness and recurrence of the lesion) and benefits of the procedure and verbal informed consent obtained.  The areas are treated with liquid nitrogen therapy, frozen until ice ball extended 2 mm beyond lesion, allowed to thaw, and treated again. The patient tolerated procedure well.  The patient was instructed on post-op care, warned that there may be blister formation, redness and pain. Recommend OTC analgesia as needed for  pain.  Condition: Stable  Complications: none.  Plan: 1. Instructed to keep the area dry and covered for 24-48h and clean thereafter. 2. Warning signs of infection were reviewed.   3. Recommended that the patient use OTC analgesics as needed for pain.  4. Return in PRN   Return if symptoms worsen or fail to improve. The time of wax

## 2016-01-14 ENCOUNTER — Telehealth: Payer: Self-pay | Admitting: Family Medicine

## 2016-01-14 LAB — PSA: PSA: 0.97 ng/mL (ref <=4.00)

## 2016-01-14 LAB — HEMOGLOBIN A1C
HEMOGLOBIN A1C: 5.2 % (ref <5.7)
Mean Plasma Glucose: 103 mg/dL

## 2016-01-14 MED ORDER — TAMSULOSIN HCL 0.4 MG PO CAPS: 0.4000 mg | ORAL_CAPSULE | Freq: Every day | ORAL | Status: DC

## 2016-01-14 NOTE — Telephone Encounter (Signed)
Will you please let patient know that his PSA test and blood sugar test were both normal and reassuring. I recommend he start on medication called Flomax that'll sent to his pharmacy. This will help relax his prostate which is probably causing some resistance when he tries to urinate resulting in incomplete emptying of his bladder.

## 2016-01-14 NOTE — Telephone Encounter (Signed)
Pt.notified

## 2016-01-24 ENCOUNTER — Telehealth: Payer: Self-pay

## 2016-01-24 MED ORDER — CEFDINIR 300 MG PO CAPS: 300.0000 mg | ORAL_CAPSULE | Freq: Two times a day (BID) | ORAL | 0 refills | Status: AC

## 2016-01-24 NOTE — Telephone Encounter (Signed)
How about trying a systemic antibiotic called cefdinir, I'll send it to the [facility] pharmacy.

## 2016-01-25 NOTE — Telephone Encounter (Signed)
Pt.notified

## 2016-03-21 ENCOUNTER — Encounter: Payer: Self-pay | Admitting: Sports Medicine

## 2016-03-21 ENCOUNTER — Ambulatory Visit (INDEPENDENT_AMBULATORY_CARE_PROVIDER_SITE_OTHER): Payer: Managed Care, Other (non HMO) | Admitting: Sports Medicine

## 2016-03-21 VITALS — BP 144/86 | HR 71 | Wt 244.0 lb

## 2016-03-21 DIAGNOSIS — M19042 Primary osteoarthritis, left hand: Secondary | ICD-10-CM

## 2016-03-21 DIAGNOSIS — M19041 Primary osteoarthritis, right hand: Secondary | ICD-10-CM

## 2016-03-21 DIAGNOSIS — Z23 Encounter for immunization: Secondary | ICD-10-CM | POA: Diagnosis not present

## 2016-03-21 HISTORY — DX: Primary osteoarthritis, right hand: M19.041

## 2016-03-21 MED ORDER — MELOXICAM 15 MG PO TABS: ORAL_TABLET | ORAL | 3 refills | Status: DC

## 2016-03-21 NOTE — Progress Notes (Signed)
   Subjective:    I'm seeing this patient as a consultation for:  Dr. Laren BoomSean Hommel  CC: Bilateral hand pain  HPI: For weeks this pleasant 45 year old male has had pain that he localizes bilaterally, left worse than right at the second MCP, worse with lifting, grasping. There is some stiffness in the mornings. Symptoms are moderate, persistent without radiation. No trauma, no constitutional symptoms.  Past medical history:  Negative.  See flowsheet/record as well for more information.  Surgical history: Negative.  See flowsheet/record as well for more information.  Family history: Negative.  See flowsheet/record as well for more information.  Social history: Negative.  See flowsheet/record as well for more information.  Allergies, and medications have been entered into the medical record, reviewed, and no changes needed.   Review of Systems: No headache, visual changes, nausea, vomiting, diarrhea, constipation, dizziness, abdominal pain, skin rash, fevers, chills, night sweats, weight loss, swollen lymph nodes, body aches, joint swelling, muscle aches, chest pain, shortness of breath, mood changes, visual or auditory hallucinations.   Objective:   General: Well Developed, well nourished, and in no acute distress.  Neuro/Psych: Alert and oriented x3, extra-ocular muscles intact, able to move all 4 extremities, sensation grossly intact. Skin: Warm and dry, no rashes noted.  Respiratory: Not using accessory muscles, speaking in full sentences, trachea midline.  Cardiovascular: Pulses palpable, no extremity edema. Abdomen: Does not appear distended. Hands: Tenderness to palpation left worse than right second MCP, worse with passive motion, neurovascularly intact distally. Minimal swelling.  Impression and Recommendations:   This case required medical decision making of moderate complexity.  Primary osteoarthritis of both hands Bilateral second metacarpophalangeal joint  osteoarthritis. Starting meloxicam, x-rays, formal hand therapy. Return to see me in one month, injection if no better.

## 2016-03-21 NOTE — Assessment & Plan Note (Signed)
Bilateral second metacarpophalangeal joint osteoarthritis. Starting meloxicam, x-rays, formal hand therapy. Return to see me in one month, injection if no better.

## 2016-09-04 ENCOUNTER — Ambulatory Visit (INDEPENDENT_AMBULATORY_CARE_PROVIDER_SITE_OTHER): Payer: BLUE CROSS/BLUE SHIELD | Admitting: Sports Medicine

## 2016-09-04 DIAGNOSIS — M7041 Prepatellar bursitis, right knee: Secondary | ICD-10-CM

## 2016-09-04 HISTORY — DX: Prepatellar bursitis, right knee: M70.41

## 2016-09-04 MED ORDER — MELOXICAM 15 MG PO TABS: ORAL_TABLET | ORAL | 3 refills | Status: DC

## 2016-09-04 NOTE — Progress Notes (Signed)
   Subjective:    I'm seeing this patient as a consultation for:  Dr. Laren BoomSean Hommel  CC: Right knee pain  HPI: In January this pleasant 46 year old male fell directly onto the anterior right knee, he only had a bit of swelling, no bruising, was able to bear weight. Unfortunately he is continued to have slight pain even though it is improving little by little. No mechanical symptoms, no swelling, able to run and jump without pain. Pain is localized directly over the anterior patella.  Past medical history:  Negative.  See flowsheet/record as well for more information.  Surgical history: Negative.  See flowsheet/record as well for more information.  Family history: Negative.  See flowsheet/record as well for more information.  Social history: Negative.  See flowsheet/record as well for more information.  Allergies, and medications have been entered into the medical record, reviewed, and no changes needed.   Review of Systems: No headache, visual changes, nausea, vomiting, diarrhea, constipation, dizziness, abdominal pain, skin rash, fevers, chills, night sweats, weight loss, swollen lymph nodes, body aches, joint swelling, muscle aches, chest pain, shortness of breath, mood changes, visual or auditory hallucinations.   Objective:   General: Well Developed, well nourished, and in no acute distress.  Neuro/Psych: Alert and oriented x3, extra-ocular muscles intact, able to move all 4 extremities, sensation grossly intact. Skin: Warm and dry, no rashes noted.  Respiratory: Not using accessory muscles, speaking in full sentences, trachea midline.  Cardiovascular: Pulses palpable, no extremity edema. Abdomen: Does not appear distended. Right Knee: Normal to inspection with no erythema or effusion or obvious bony abnormalities. Palpation normal with no warmth or joint line tenderness or patellar tenderness or condyle tenderness. There is only minimal tenderness directly over the anterior patella with  a palpable nodule consistent with a prepatellar bursa. ROM normal in flexion and extension and lower leg rotation. Ligaments with solid consistent endpoints including ACL, PCL, LCL, MCL. Anterior cruciate ligament stress testing is showing some laxity but is symmetric with the contralateral side. Negative Mcmurray's and provocative meniscal tests. Non painful patellar compression. Patellar and quadriceps tendons unremarkable. Hamstring and quadriceps strength is normal.  Impression and Recommendations:   This case required medical decision making of moderate complexity.  Prepatellar bursitis, right knee Traumatic prepatellar bursitis after a fall back in January, x-rays at an outside facility were negative for fracture. Exam today the benign with the exception of mild sensitivity over the anterior patella with a palpable somewhat decompressed prepatellar bursa. Meloxicam, reaction knee brace, and the tincture of time. If insufficient or incomplete relief in one month we will proceed with an MRI.

## 2016-09-04 NOTE — Assessment & Plan Note (Signed)
Traumatic prepatellar bursitis after a fall back in January, x-rays at an outside facility were negative for fracture. Exam today the benign with the exception of mild sensitivity over the anterior patella with a palpable somewhat decompressed prepatellar bursa. Meloxicam, reaction knee brace, and the tincture of time. If insufficient or incomplete relief in one month we will proceed with an MRI.

## 2016-10-02 ENCOUNTER — Ambulatory Visit: Payer: BLUE CROSS/BLUE SHIELD | Admitting: Sports Medicine

## 2016-12-05 ENCOUNTER — Ambulatory Visit (INDEPENDENT_AMBULATORY_CARE_PROVIDER_SITE_OTHER): Payer: BLUE CROSS/BLUE SHIELD | Admitting: Sports Medicine

## 2016-12-05 ENCOUNTER — Ambulatory Visit (INDEPENDENT_AMBULATORY_CARE_PROVIDER_SITE_OTHER): Payer: BLUE CROSS/BLUE SHIELD

## 2016-12-05 ENCOUNTER — Encounter: Payer: Self-pay | Admitting: Sports Medicine

## 2016-12-05 DIAGNOSIS — M25512 Pain in left shoulder: Secondary | ICD-10-CM

## 2016-12-05 DIAGNOSIS — G8929 Other chronic pain: Secondary | ICD-10-CM

## 2016-12-05 DIAGNOSIS — M25511 Pain in right shoulder: Secondary | ICD-10-CM

## 2016-12-05 HISTORY — DX: Pain in right shoulder: M25.511

## 2016-12-05 MED ORDER — MELOXICAM 15 MG PO TABS: ORAL_TABLET | ORAL | 3 refills | Status: DC

## 2016-12-05 NOTE — Progress Notes (Signed)
   Subjective:    I'm seeing this patient as a consultation for:  Dr. Laren BoomSean Hommel  CC: Left worse than right shoulder pain  HPI: This is a pleasant 46 year old male, for years now he's had bilateral shoulder pain, more recently he was trying to uproot a bush, and developed severe left shoulder pain over the deltoid, worse with overhead activities. He was worried that he tore his rotator cuff, pain is severe, persistent. Localized over the deltoid but no radiation past the elbow.  Past medical history:  Negative.  See flowsheet/record as well for more information.  Surgical history: Negative.  See flowsheet/record as well for more information.  Family history: Negative.  See flowsheet/record as well for more information.  Social history: Negative.  See flowsheet/record as well for more information.  Allergies, and medications have been entered into the medical record, reviewed, and no changes needed.   Review of Systems: No headache, visual changes, nausea, vomiting, diarrhea, constipation, dizziness, abdominal pain, skin rash, fevers, chills, night sweats, weight loss, swollen lymph nodes, body aches, joint swelling, muscle aches, chest pain, shortness of breath, mood changes, visual or auditory hallucinations.   Objective:   General: Well Developed, well nourished, and in no acute distress.  Neuro/Psych: Alert and oriented x3, extra-ocular muscles intact, able to move all 4 extremities, sensation grossly intact. Skin: Warm and dry, no rashes noted.  Respiratory: Not using accessory muscles, speaking in full sentences, trachea midline.  Cardiovascular: Pulses palpable, no extremity edema. Abdomen: Does not appear distended. Left Shoulder: Inspection reveals no abnormalities, atrophy or asymmetry. No tenderness over the bicipital groove. Moderate pain over the acromioclavicular joint ROM is full in all planes. Rotator cuff strength normal throughout. Positive Neer and Hawkin's tests,  empty can. Speeds and Yergason's tests normal. No labral pathology noted with negative Obrien's, negative crank, negative clunk, and good stability. Normal scapular function observed. No painful arc and no drop arm sign. No apprehension sign  Procedure: Real-time Ultrasound Guided Injection of left subacromial bursa Device: GE Logiq E  Verbal informed consent obtained.  Time-out conducted.  Noted no overlying erythema, induration, or other signs of local infection.  Skin prepped in a sterile fashion.  Local anesthesia: Topical Ethyl chloride.  With sterile technique and under real time ultrasound guidance:  Noted intact rotator cuff, possibly a small partial with, partial-thickness articular sided insertional tear, 1 mL kenalog 40, 1 mL lidocaine, 1 mL bupivacaine injected easily into the subacromial bursa. Completed without difficulty  Pain immediately resolved suggesting accurate placement of the medication.  Advised to call if fevers/chills, erythema, induration, drainage, or persistent bleeding.  Images permanently stored and available for review in the ultrasound unit.  Impression: Technically successful ultrasound guided injection.  Impression and Recommendations:   This case required medical decision making of moderate complexity.  Bilateral shoulder pain Left worse than right shoulder pain. Impingement symptoms bilaterally. Left side is waking him from sleep, injected today. Formal physical therapy, bilateral x-rays, return in one month. Meloxicam.

## 2016-12-05 NOTE — Assessment & Plan Note (Signed)
Left worse than right shoulder pain. Impingement symptoms bilaterally. Left side is waking him from sleep, injected today. Formal physical therapy, bilateral x-rays, return in one month. Meloxicam.

## 2016-12-25 ENCOUNTER — Ambulatory Visit (INDEPENDENT_AMBULATORY_CARE_PROVIDER_SITE_OTHER): Payer: BLUE CROSS/BLUE SHIELD | Admitting: Rehabilitative and Restorative Service Providers"

## 2016-12-25 DIAGNOSIS — R29898 Other symptoms and signs involving the musculoskeletal system: Secondary | ICD-10-CM | POA: Diagnosis not present

## 2016-12-25 DIAGNOSIS — M25512 Pain in left shoulder: Secondary | ICD-10-CM

## 2016-12-25 DIAGNOSIS — M25511 Pain in right shoulder: Secondary | ICD-10-CM | POA: Diagnosis not present

## 2016-12-25 DIAGNOSIS — R293 Abnormal posture: Secondary | ICD-10-CM | POA: Diagnosis not present

## 2016-12-25 DIAGNOSIS — G8929 Other chronic pain: Secondary | ICD-10-CM | POA: Diagnosis not present

## 2016-12-25 NOTE — Therapy (Signed)
[facility] [address] [place], [place], [address] Phone: [phone]   Fax:  [phone]  Physical Therapy Evaluation  Patient Details  Name: Michael Callahan MRN: [id number] Date of Birth: 1970-09-07 Referring Provider: Dr Benjamin Stainhekkekandam   Encounter Date: 12/25/2016      PT End of Session - 12/25/16 1403    Visit Number 1   Number of Visits 12   Date for PT Re-Evaluation 02/05/17   PT Start Time 1403   PT Stop Time 1500   PT Time Calculation (min) 57 min   Activity Tolerance Patient tolerated treatment well      Past Medical History:  Diagnosis Date  . Seasonal allergies 03/24/2013    No past surgical history on file.  There were no vitals filed for this visit.       Subjective Assessment - 12/25/16 1410    Subjective Patient reports that he has bilat shoulder pain Lt worse than Rt. MD diagnosed shoulders with arthritis and "slight tear" in the Rt shoulder. He has had pain for several months with no known injury.    Pertinent History denies any musculoskeletal problems    How long can you sit comfortably? no limit   How long can you stand comfortably? no limit   How long can you walk comfortably? 3 miles    Diagnostic tests xrays    Patient Stated Goals build strength in Lt arm to pick things up and use the Lt arm    Currently in Pain? Yes   Pain Score 3    Pain Location Shoulder   Pain Orientation Left   Pain Descriptors / Indicators Throbbing   Pain Type Chronic pain   Pain Onset More than a month ago   Pain Frequency Constant   Aggravating Factors  walking > 3 miles; sleeping; lying on side; reaching; lifting; shoulder exercises; swimming free style    Pain Relieving Factors meds; ice   Multiple Pain Sites Yes   Pain Score 4   Pain Location Shoulder   Pain Orientation Right   Pain Descriptors / Indicators Throbbing   Pain Type Chronic pain   Pain Onset More than a month ago   Pain Frequency  Constant   Aggravating Factors  as above    Pain Relieving Factors as above             [facility] PT Assessment - 12/25/16 0001      Assessment   Medical Diagnosis Bilat shoudler pain    Referring Provider Dr Benjamin Stainhekkekandam    Onset Date/Surgical Date 09/24/16   Hand Dominance Right   Next MD Visit 01/04/17   Prior Therapy none     Precautions   Precautions None     Balance Screen   Has the patient fallen in the past 6 months No   Has the patient had a decrease in activity level because of a fear of falling?  No   Is the patient reluctant to leave their home because of a fear of falling?  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    Leisure swimming; sports shooting; cleaning pool      Observation/Other Assessments   Focus on Therapeutic Outcomes (FOTO)  20% limitation      Sensation   Additional Comments WFL's per pt report      Posture/Postural Control  Posture Comments head forward; shoulders rounded and elevated; scapulae abducted and rotated along the thoracic wall; increased thoracic kyphosis; arms in some IR at sides; sits with forward posture shoudlers rounded      AROM   Right/Left Shoulder --  tight end ranges bilat shoulders at 160-165 deg flex/abd      Strength   Right/Left Shoulder --  5/5 biulat shds except mid/lower traps 4/5 wi/discomfort Lt     Palpation   Palpation comment muscular tightness bialt pecs; traps; leveator; teres; lats Lt > Rt      Special Tests    Special Tests --  scapular dyskinesis bilat             Objective measurements completed on examination: See above findings.          [facility] Adult PT Treatment/Exercise - 12/25/16 0001      Self-Care   Self-Care --  sitting posture with noodle; sleeping positions      Therapeutic Activites    Therapeutic Activities --  myofacial ball release work      Neuro Re-ed    Neuro  Re-ed Details  working on posture and alignment engagning posterior shoulder girdle      Shoulder Exercises: Supine   Other Supine Exercises prolonged snow angel 2-3 min arms at ~80 deg      Shoulder Exercises: Standing   Retraction Limitations W's x 10    Other Standing Exercises axial extension 10 sec x 5 with noodle    Other Standing Exercises scap squeeze with noodle 10 sec x 10      Shoulder Exercises: Stretch   Corner Stretch --  3 way doorway 30 sec x 3 each    Other Shoulder Stretches biceps stretch 30 sec x 2 each UE      Cryotherapy   Number Minutes Cryotherapy 20 Minutes   Cryotherapy Location Shoulder  bilat    Type of Cryotherapy Ice pack     Electrical Stimulation   Electrical Stimulation Location bilat ant shoulders    Electrical Stimulation Action premod   Electrical Stimulation Parameters to tolerance   Electrical Stimulation Goals Tone;Pain                PT Education - 12/25/16 1503    Education provided Yes   Education Details HEP TENS DN    Person(s) Educated Patient   Methods Explanation;Demonstration;Tactile cues;Verbal cues;Handout   Comprehension Verbalized understanding;Returned demonstration;Verbal cues required;Tactile cues required             PT Long Term Goals - 12/25/16 1703      PT LONG TERM GOAL #1   Title Improve posture and alignment with patient to demonstrate good activation of posterior shoudler girdle musculature 02/05/17   Time 6   Period Weeks   Status New     PT LONG TERM GOAL #2   Title Improve tissue extensibility through anterior chest; strengthen middle and lower traps to 5/5 POC 02/05/17   Time 6   Period Weeks   Status New     PT LONG TERM GOAL #3   Title Patient reports 75-80% improvement in pain with functional activities involving bilat UE's 02/05/17   Time 6   Period Weeks   Status New     PT LONG TERM GOAL #4   Title Independent in HEP 02/05/17   Time 6   Period Weeks   Status New     PT  LONG TERM GOAL #5   Title  Improve FOTO to </= 19% limitation 02/05/17   Time 6   Period Weeks   Status New                Plan - 12/25/16 1659    Clinical Impression Statement Pink presents with bilat chronic shoulder pain with poor posture and alignment; scapular dyskinesis; end range decreased shoulder ROM; muscular imbalance; tightness through pecs and traps; weakness through middle and lower traps; abnormal movement patterns; pain on a daliy basis. Patient will benefit from PT to address problems identified.    Clinical Presentation Evolving   Clinical Decision Making Low   Rehab Potential Good   PT Frequency 2x / week   PT Duration 6 weeks   PT Treatment/Interventions Patient/family education;ADLs/Self Care Home Management;Cryotherapy;Electrical Stimulation;Iontophoresis 4mg /ml Dexamethasone;Moist Heat;Ultrasound;Dry needling;Manual techniques;Therapeutic activities;Therapeutic exercise;Neuromuscular re-education   PT Next Visit Plan review exercise; continue neuromuscular re-education; stretch pecs; strengthen posterior shoudler girdle; DN v manual work Gaffer; Scientific laboratory technician as indicated    Financial planner with Plan of Care Patient      Patient will benefit from skilled therapeutic intervention in order to improve the following deficits and impairments:  Postural dysfunction, Improper body mechanics, Pain, Impaired UE functional use, Decreased range of motion, Decreased mobility, Decreased strength, Decreased activity tolerance  Visit Diagnosis: Chronic right shoulder pain - Plan: PT plan of care cert/re-cert  Chronic left shoulder pain - Plan: PT plan of care cert/re-cert  Other symptoms and signs involving the musculoskeletal system - Plan: PT plan of care cert/re-cert  Abnormal posture - Plan: PT plan of care cert/re-cert     Problem List Patient Active Problem List   Diagnosis Date Noted  . Bilateral shoulder pain 12/05/2016  . Prepatellar bursitis, right knee  09/04/2016  . Primary osteoarthritis of both hands 03/21/2016  . Hyperglycemia 12/02/2014  . Seasonal allergies 03/24/2013  . 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 ALLERGIC RHINITIS 05/14/2008    Celyn Rober Minion PT, MPH  12/25/2016, 5:41 PM  [facility] [address] [place], [place], [address] Phone: [phone]   Fax:  [phone]  Name: Michael Callahan MRN: [id number] Date of Birth: 16-Jun-1971

## 2016-12-25 NOTE — Patient Instructions (Addendum)
Axial Extension (Chin Tuck)    Pull chin in and lengthen back of neck. Hold __10-15__ seconds while counting out loud. Repeat __3-5__ times. Do _several ___ sessions per day.     Shoulder Blade Squeeze   Noodle along the back.  Rotate shoulders back, then squeeze shoulder blades down and back. Hold 10 sec Repeat _10___ times. Do _several ___ sessions per day.  Scapula Adduction With Pectoralis Stretch: Low - Standing   Shoulders at 45 hands even with shoulders, keeping weight through legs, shift weight forward until you feel pull or stretch through the front of your chest. Hold _30__ seconds. Do _3__ times, _2-4__ times per day.   Scapula Adduction With Pectoralis Stretch: Mid-Range - Standing   Shoulders at 90 elbows even with shoulders, keeping weight through legs, shift weight forward until you feel pull or strength through the front of your chest. Hold __30_ seconds. Do _3__ times, __2-4_ times per day.   Scapula Adduction With Pectoralis Stretch: High - Standing   Shoulders at 120 hands up high on the doorway, keeping weight on feet, shift weight forward until you feel pull or stretch through the front of your chest. Hold _30__ seconds. Do _3__ times, _2-3__ times per day.  ELBOW: Biceps - Standing    Standing in doorway, place one hand on wall, elbow straight. Lean forward. Hold __30_ seconds. _3__ reps per set several per day   Scapular Retraction: Elbow Flexion (Standing)    With elbows bent to 90, pinch shoulder blades together and rotate arms out, keeping elbows bent. Repeat __10__ times per set. Do __1-3__ sets per session. Do __2-3__ sessions per day.    SUPINE Tips A    Being in the supine position means to be lying on the back. Lying on the back is the position of least compression on the bones and discs of the spine, and helps to re-align the natural curves of the back. Work toward 2-5 min  Gradually bring arms up closer toward ears     TENS UNIT: This is helpful for muscle pain and spasm.   Search and Purchase a TENS 7000 2nd edition at www.tenspros.com. It should be less than $30.     TENS unit instructions: Do not shower or bathe with the unit on Turn the unit off before removing electrodes or batteries If the electrodes lose stickiness add a drop of water to the electrodes after they are disconnected from the unit and place on plastic sheet. If you continued to have difficulty, call the TENS unit company to purchase more electrodes. Do not apply lotion on the skin area prior to use. Make sure the skin is clean and dry as this will help prolong the life of the electrodes. After use, always check skin for unusual red areas, rash or other skin difficulties. If there are any skin problems, does not apply electrodes to the same area. Never remove the electrodes from the unit by pulling the wires. Do not use the TENS unit or electrodes other than as directed. Do not change electrode placement without consultating your therapist or physician. Keep 2 fingers with between each electrode.  Trigger Point Dry Needling  . What is Trigger Point Dry Needling (DN)? o DN is a physical therapy technique used to treat muscle pain and dysfunction. Specifically, DN helps deactivate muscle trigger points (muscle knots).  o A thin filiform needle is used to penetrate the skin and stimulate the underlying trigger point. The goal is for a local  twitch response (LTR) to occur and for the trigger point to relax. No medication of any kind is injected during the procedure.   . What Does Trigger Point Dry Needling Feel Like?  o The procedure feels different for each individual patient. Some patients report that they do not actually feel the needle enter the skin and overall the process is not painful. Very mild bleeding may occur. However, many patients feel a deep cramping in the muscle in which the needle was inserted. This is the local  twitch response.   Marland Kitchen. How Will I feel after the treatment? o Soreness is normal, and the onset of soreness may not occur for a few hours. Typically this soreness does not last longer than two days.  o Bruising is uncommon, however; ice can be used to decrease any possible bruising.  o In rare cases feeling tired or nauseous after the treatment is normal. In addition, your symptoms may get worse before they get better, this period will typically not last longer than 24 hours.   . What Can I do After My Treatment? o Increase your hydration by drinking more water for the next 24 hours. o You may place ice or heat on the areas treated that have become sore, however, do not use heat on inflamed or bruised areas. Heat often brings more relief post needling. o You can continue your regular activities, but vigorous activity is not recommended initially after the treatment for 24 hours. o DN is best combined with other physical therapy such as strengthening, stretching, and other therapies.

## 2017-01-03 ENCOUNTER — Ambulatory Visit: Payer: BLUE CROSS/BLUE SHIELD | Admitting: Sports Medicine

## 2017-01-03 ENCOUNTER — Ambulatory Visit: Payer: BLUE CROSS/BLUE SHIELD | Admitting: Physician Assistant

## 2017-01-03 ENCOUNTER — Encounter: Payer: Self-pay | Admitting: Rehabilitative and Restorative Service Providers"

## 2017-01-03 ENCOUNTER — Ambulatory Visit (INDEPENDENT_AMBULATORY_CARE_PROVIDER_SITE_OTHER): Payer: BLUE CROSS/BLUE SHIELD | Admitting: Rehabilitative and Restorative Service Providers"

## 2017-01-03 DIAGNOSIS — M25511 Pain in right shoulder: Secondary | ICD-10-CM | POA: Diagnosis not present

## 2017-01-03 DIAGNOSIS — R29898 Other symptoms and signs involving the musculoskeletal system: Secondary | ICD-10-CM

## 2017-01-03 DIAGNOSIS — G8929 Other chronic pain: Secondary | ICD-10-CM

## 2017-01-03 DIAGNOSIS — M25512 Pain in left shoulder: Secondary | ICD-10-CM | POA: Diagnosis not present

## 2017-01-03 DIAGNOSIS — R293 Abnormal posture: Secondary | ICD-10-CM | POA: Diagnosis not present

## 2017-01-03 NOTE — Therapy (Signed)
[facility] [address] [place], [place], [address] Phone: [phone]   Fax:  [phone]  Physical Therapy Treatment  Patient Details  Name: Michael Callahan MRN: [id number] Date of Birth: 1971/04/16 Referring Provider: Dr Benjamin Stain   Encounter Date: 01/03/2017      PT End of Session - 01/03/17 1513    Visit Number 2   Number of Visits 12   Date for PT Re-Evaluation 02/05/17   PT Start Time 1514   PT Stop Time 1611   PT Time Calculation (min) 57 min   Activity Tolerance Patient tolerated treatment well      Past Medical History:  Diagnosis Date  . Seasonal allergies 03/24/2013    History reviewed. No pertinent surgical history.  There were no vitals filed for this visit.      Subjective Assessment - 01/03/17 1516    Subjective Patient reports that he has not been working on his exercises much. Did use the noodle with driving. Has had increased neck pain over the past three weeks. Did get the TENS unit but can't tell it helps much.    Currently in Pain? Yes   Pain Score 2    Pain Location Shoulder   Pain Orientation Left   Pain Descriptors / Indicators Aching;Dull   Pain Score 3   Pain Location Shoulder   Pain Orientation Right   Pain Descriptors / Indicators Aching;Dull   Pain Type Chronic pain                         [facility] Adult PT Treatment/Exercise - 01/03/17 0001      Therapeutic Activites    Therapeutic Activities --  myofacial ball release work      Neuro Re-ed    Neuro Re-ed Details  working on posture and alignment engagning posterior shoulder girdle      Shoulder Exercises: Supine   Other Supine Exercises prolonged snow angel 2-3 min arms at ~80 deg      Shoulder Exercises: Standing   Retraction Strengthening;Both;10 reps;Theraband   Theraband Level (Shoulder Retraction) Level 2 (Red)   Retraction Limitations W's x 10    Other Standing Exercises axial extension 10  sec x 5 with noodle    Other Standing Exercises scap squeeze with noodle 10 sec x 10      Shoulder Exercises: Stretch   Corner Stretch --  3 way doorway 30 sec x 3 each    Other Shoulder Stretches biceps stretch 30 sec x 2 each UE      Moist Heat Therapy   Number Minutes Moist Heat 20 Minutes   Moist Heat Location Cervical;Shoulder  Rt      Electrical Stimulation   Electrical Stimulation Location bilat ant shoulders    Electrical Stimulation Action IFC   Electrical Stimulation Parameters to tolerance   Electrical Stimulation Goals Tone;Pain     Manual Therapy   Manual therapy comments pt prone    Joint Mobilization thoracic mobs   Soft tissue mobilization deep tissue work through the [employer] musculature especially through the leveator and upper traps/lower traps - working on the Rt    Myofascial Release Rt posterior shoudler girdle    Scapular Mobilization inferior glide Rt scapula           Trigger Point Dry Needling - 01/03/17 1545    Consent Given? Yes   Education Handout Provided Yes   Muscles Treated Upper Body --  Rt with estim    Upper Trapezius Response Palpable increased muscle length   Levator Scapulae Response Palpable increased muscle length   Rhomboids Response Palpable increased muscle length   Longissimus Response Palpable increased muscle length              PT Education - 01/03/17 1527    Education provided Yes   Education Details HEP    Person(s) Educated Patient   Methods Explanation;Demonstration;Tactile cues;Verbal cues;Handout   Comprehension Verbalized understanding;Returned demonstration;Verbal cues required;Tactile cues required             PT Long Term Goals - 01/03/17 1514      PT LONG TERM GOAL #1   Title Improve posture and alignment with patient to demonstrate good activation of posterior shoudler girdle musculature 02/05/17   Time 6   Period Weeks   Status On-going     PT LONG TERM GOAL #2   Title Improve  tissue extensibility through anterior chest; strengthen middle and lower traps to 5/5 POC 02/05/17   Time 6   Period Weeks   Status On-going     PT LONG TERM GOAL #3   Title Patient reports 75-80% improvement in pain with functional activities involving bilat UE's 02/05/17   Time 6   Period Weeks   Status On-going     PT LONG TERM GOAL #4   Title Independent in HEP 02/05/17   Time 6   Period Weeks   Status On-going     PT LONG TERM GOAL #5   Title Improve FOTO to </= 19% limitation 02/05/17   Time 6   Period Weeks   Status On-going               Plan - 01/03/17 1706    Clinical Impression Statement Michael Callahan has not worked with any consistency on his exercise program. He has not been able to find a ball for myofacial release. He responded well to DN trial today with good release of muscular tightness through the Rt shoudler girdle - especially tight through the leveator. Exercises were reviewed with only one exercise added until patient is consistent with current HEP. No goals accomplished.    Rehab Potential Good   PT Frequency 2x / week   PT Duration 6 weeks   PT Treatment/Interventions Patient/family education;ADLs/Self Care Home Management;Cryotherapy;Electrical Stimulation;Iontophoresis 4mg /ml Dexamethasone;Moist Heat;Ultrasound;Dry needling;Manual techniques;Therapeutic activities;Therapeutic exercise;Neuromuscular re-education   PT Next Visit Plan review exercise; continue neuromuscular re-education; stretch pecs; strengthen posterior shoudler girdle; assess response to DN and manual work through posterior shoulder girdle; add work for pecs; Scientific laboratory technician as indicated    Financial planner with Plan of Care Patient      Patient will benefit from skilled therapeutic intervention in order to improve the following deficits and impairments:  Postural dysfunction, Improper body mechanics, Pain, Impaired UE functional use, Decreased range of motion, Decreased mobility, Decreased  strength, Decreased activity tolerance  Visit Diagnosis: Chronic right shoulder pain  Chronic left shoulder pain  Other symptoms and signs involving the musculoskeletal system  Abnormal posture     Problem List Patient Active Problem List   Diagnosis Date Noted  . Bilateral shoulder pain 12/05/2016  . Prepatellar bursitis, right knee 09/04/2016  . Primary osteoarthritis of both hands 03/21/2016  . Hyperglycemia 12/02/2014  . Seasonal allergies 03/24/2013  . 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 ALLERGIC RHINITIS 05/14/2008    Celyn Rober Minion PT, MPH  01/03/2017, 5:13 PM   [facility] [address]  [address] [place], [place], [address] Phone: [phone]   Fax:  [phone]  Name: Michael Callahan MRN: [id number] Date of Birth: [birth year]

## 2017-01-03 NOTE — Patient Instructions (Addendum)
Resisted External Rotation: in Neutral - Bilateral   PALMS UP Sit or stand, tubing in both hands, elbows at sides, bent to 90, forearms forward. Pinch shoulder blades together and rotate forearms out. Keep elbows at sides. Repeat __10__ times per set. Do _2-3___ sets per session. Do _2-3___ sessions per day.   Trigger Point Dry Needling  . What is Trigger Point Dry Needling (DN)? o DN is a physical therapy technique used to treat muscle pain and dysfunction. Specifically, DN helps deactivate muscle trigger points (muscle knots).  o A thin filiform needle is used to penetrate the skin and stimulate the underlying trigger point. The goal is for a local twitch response (LTR) to occur and for the trigger point to relax. No medication of any kind is injected during the procedure.   . What Does Trigger Point Dry Needling Feel Like?  o The procedure feels different for each individual patient. Some patients report that they do not actually feel the needle enter the skin and overall the process is not painful. Very mild bleeding may occur. However, many patients feel a deep cramping in the muscle in which the needle was inserted. This is the local twitch response.   Michael Callahan. How Will I feel after the treatment? o Soreness is normal, and the onset of soreness may not occur for a few hours. Typically this soreness does not last longer than two days.  o Bruising is uncommon, however; ice can be used to decrease any possible bruising.  o In rare cases feeling tired or nauseous after the treatment is normal. In addition, your symptoms may get worse before they get better, this period will typically not last longer than 24 hours.   . What Can I do After My Treatment? o Increase your hydration by drinking more water for the next 24 hours. o You may place ice or heat on the areas treated that have become sore, however, do not use heat on inflamed or bruised areas. Heat often brings more relief post  needling. o You can continue your regular activities, but vigorous activity is not recommended initially after the treatment for 24 hours. o DN is best combined with other physical therapy such as strengthening, stretching, and other therapies.

## 2017-01-11 ENCOUNTER — Encounter: Payer: Self-pay | Admitting: Rehabilitative and Restorative Service Providers"

## 2017-01-11 ENCOUNTER — Ambulatory Visit (INDEPENDENT_AMBULATORY_CARE_PROVIDER_SITE_OTHER): Payer: BLUE CROSS/BLUE SHIELD | Admitting: Rehabilitative and Restorative Service Providers"

## 2017-01-11 DIAGNOSIS — M25511 Pain in right shoulder: Secondary | ICD-10-CM | POA: Diagnosis not present

## 2017-01-11 DIAGNOSIS — M25512 Pain in left shoulder: Secondary | ICD-10-CM | POA: Diagnosis not present

## 2017-01-11 DIAGNOSIS — G8929 Other chronic pain: Secondary | ICD-10-CM

## 2017-01-11 DIAGNOSIS — R29898 Other symptoms and signs involving the musculoskeletal system: Secondary | ICD-10-CM | POA: Diagnosis not present

## 2017-01-11 DIAGNOSIS — R293 Abnormal posture: Secondary | ICD-10-CM

## 2017-01-11 NOTE — Patient Instructions (Signed)
Posterior shoulder girdle engaged  Arms at side - reach for the floor   Resisted External Rotation: in Neutral - Bilateral   PALMS UP Sit or stand, tubing in both hands, elbows at sides, bent to 90, forearms forward. Pinch shoulder blades together and rotate forearms out. Keep elbows at sides. Repeat __10__ times per set. Do _2-3___ sets per session. Do _2-3___ sessions per day.   Low Row: Standing   Face anchor, feet shoulder width apart. Palms up, pull arms back, squeezing shoulder blades together. Repeat 10__ times per set. Do 2-3__ sets per session. Do 2-3__ sessions per day Anchor Height: Waist     Strengthening: Resisted Extension   Hold tubing in right hand, arm forward. Pull arm back, elbow straight. Repeat _10___ times per set. Do 2-3____ sets per session. Do 2-3____ sessions per day.

## 2017-01-11 NOTE — Therapy (Addendum)
[place], [place], [address] Phone: [phone]   Fax:  [phone]  Physical Therapy Treatment  Patient Details  Name: Michael Callahan MRN: [id number] Date of Birth: [birth year] Referring Provider: Dr Dianah Field   Encounter Date: 01/11/2017      PT End of Session - 01/11/17 0807    Visit Number 3   Number of Visits 12   Date for PT Re-Evaluation 02/05/17   PT Start Time 0806   PT Stop Time 0900   PT Time Calculation (min) 54 min   Activity Tolerance Patient tolerated treatment well      Past Medical History:  Diagnosis Date  . Seasonal allergies 03/24/2013    History reviewed. No pertinent surgical history.  There were no vitals filed for this visit.      Subjective Assessment - 01/11/17 0811    Subjective Improving. Less tightness. Some soreness but less pain. He is more mobile and can look to side more easily now. Still has a "crinck" in his neck with looking Rt    Currently in Pain? No/denies   Pain Location Shoulder   Pain Descriptors / Indicators Tightness   Pain Type Chronic pain   Pain Frequency Intermittent   Pain Location Shoulder   Pain Orientation Left   Pain Type Chronic pain   Pain Frequency Intermittent            [facility] PT Assessment - 01/11/17 0001      Assessment   Medical Diagnosis Bilat shoudler pain    Referring Provider Dr Dianah Field    Onset Date/Surgical Date 09/24/16   Hand Dominance Right   Next MD Visit PRN      AROM   Overall AROM  --  improving mobility and ROM through cervical spine and shds     Palpation   Palpation comment muscular tightness bialt pecs; traps; leveator; teres; lats Lt > Rt                      [facility] Adult PT Treatment/Exercise - 01/11/17 0001      Shoulder Exercises: Standing   Extension Strengthening;Both;20 reps;Theraband   Theraband Level (Shoulder Extension) Level 3 (Green)   Row  Strengthening;Both;20 reps;Theraband   Theraband Level (Shoulder Row) Level 3 (Green)   Retraction Strengthening;Both;10 reps;Theraband  added isometric ER red TB   Theraband Level (Shoulder Retraction) Level 2 (Red)   Retraction Limitations W's x 10    Other Standing Exercises axial extension 10 sec x 5 with noodle    Other Standing Exercises scap squeeze with noodle 10 sec x 10; reach for floor - scapular depression 10 sec hold x 10       Shoulder Exercises: Therapy Ball   Flexion --  3 reps 30 sec hold stepping under ball on wall      Shoulder Exercises: Stretch   Corner Stretch --  3 way doorway 30 sec x 3 each    Other Shoulder Stretches biceps stretch 30 sec x 2 each UE      Moist Heat Therapy   Number Minutes Moist Heat 20 Minutes   Moist Heat Location Cervical;Shoulder  Rt      Electrical Stimulation   Electrical Stimulation Location bilat ant shoulders    Electrical Stimulation Action IFC   Electrical Stimulation Parameters to tolerance   Electrical Stimulation Goals Tone;Pain     Manual Therapy   Manual therapy comments pt supine  Soft tissue mobilization deep tissue work through pecs; anterior chest    Myofascial Release anterior chest           Trigger Point Dry Needling - 01/11/17 0846    Muscles Treated Upper Body --  Lt    Pectoralis Major Response Palpable increased muscle length;Twitch response elicited   Pectoralis Minor Response Palpable increased muscle length;Twitch response elicited              PT Education - 01/11/17 0825    Education provided Yes   Education Details HEP    Person(s) Educated Patient   Methods Explanation;Demonstration;Tactile cues;Verbal cues;Handout   Comprehension Verbalized understanding;Returned demonstration;Verbal cues required;Tactile cues required             PT Long Term Goals - 01/11/17 0807      PT LONG TERM GOAL #1   Title Improve posture and alignment with patient to demonstrate good  activation of posterior shoudler girdle musculature 02/05/17   Time 6   Period Weeks   Status On-going     PT LONG TERM GOAL #2   Title Improve tissue extensibility through anterior chest; strengthen middle and lower traps to 5/5 POC 02/05/17   Time 6   Period Weeks   Status On-going     PT LONG TERM GOAL #3   Title Patient reports 75-80% improvement in pain with functional activities involving bilat UE's 02/05/17   Time 6   Period Weeks   Status On-going     PT LONG TERM GOAL #4   Title Independent in HEP 02/05/17   Time 6   Period Weeks   Status On-going     PT LONG TERM GOAL #5   Title Improve FOTO to </= 19% limitation 02/05/17   Time 6   Period Weeks   Status On-going               Plan - 01/11/17 1572    Clinical Impression Statement Responded well to DN with report of improving symptoms. He has less pain and tightness through the cervical spine. There are continued myofacial restrictions and muscular tightness through the anterior and posterior cervical spine. Progressing well toward stated goals of therapy. Needs continued work on postural correction - stretching anteriorly and strengthening posteriorly.    Rehab Potential Good   PT Frequency 2x / week   PT Treatment/Interventions Patient/family education;ADLs/Self Care Home Management;Cryotherapy;Electrical Stimulation;Iontophoresis 63m/ml Dexamethasone;Moist Heat;Ultrasound;Dry needling;Manual techniques;Therapeutic activities;Therapeutic exercise;Neuromuscular re-education   PT Next Visit Plan review exercise; continue neuromuscular re-education; stretch pecs; strengthen posterior shoudler girdle; continue DN and manual work through posterior shoulder girdle; add work for pecs; mInterior and spatial designeras indicated    COncologistwith Plan of Care Patient      Patient will benefit from skilled therapeutic intervention in order to improve the following deficits and impairments:  Postural dysfunction, Improper body  mechanics, Pain, Impaired UE functional use, Decreased range of motion, Decreased mobility, Decreased strength, Decreased activity tolerance  Visit Diagnosis: Chronic right shoulder pain  Chronic left shoulder pain  Other symptoms and signs involving the musculoskeletal system  Abnormal posture     Problem List Patient Active Problem List   Diagnosis Date Noted  . Bilateral shoulder pain 12/05/2016  . Prepatellar bursitis, right knee 09/04/2016  . Primary osteoarthritis of both hands 03/21/2016  . Hyperglycemia 12/02/2014  . Seasonal allergies 03/24/2013  . 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 ALLERGIC RHINITIS 05/14/2008    Celyn PNilda SimmerPT, MPH  01/11/2017, 8:48  [place], [place], [address] Phone: [phone]   Fax:  [phone]  Name: Michael Callahan MRN: [id number] Date of Birth: 1971/01/19  PHYSICAL THERAPY DISCHARGE SUMMARY  Visits from Start of Care: 3  Current functional level related to goals / functional outcomes: See progress note for discharge status   Remaining deficits: Unknown    Education / Equipment: HEP  Plan: Patient agrees to discharge.  Patient goals were partially met. Patient is being discharged due to not returning since the last visit.  ?????     Celyn P. Helene Kelp PT, MPH 03/12/17 10:39 AM

## 2017-01-16 ENCOUNTER — Encounter: Payer: BLUE CROSS/BLUE SHIELD | Admitting: Physical Therapy

## 2017-01-18 ENCOUNTER — Encounter: Payer: BLUE CROSS/BLUE SHIELD | Admitting: Physical Therapy

## 2017-02-01 ENCOUNTER — Ambulatory Visit: Payer: BLUE CROSS/BLUE SHIELD | Admitting: Physician Assistant

## 2017-02-05 ENCOUNTER — Ambulatory Visit: Payer: BLUE CROSS/BLUE SHIELD | Admitting: Physician Assistant

## 2017-02-06 ENCOUNTER — Encounter: Payer: BLUE CROSS/BLUE SHIELD | Admitting: Rehabilitative and Restorative Service Providers"

## 2017-02-07 ENCOUNTER — Ambulatory Visit (INDEPENDENT_AMBULATORY_CARE_PROVIDER_SITE_OTHER): Payer: BLUE CROSS/BLUE SHIELD | Admitting: Physician Assistant

## 2017-02-07 ENCOUNTER — Encounter: Payer: Self-pay | Admitting: Physician Assistant

## 2017-02-07 VITALS — BP 119/74 | HR 67 | Wt 233.0 lb

## 2017-02-07 DIAGNOSIS — Z13220 Encounter for screening for lipoid disorders: Secondary | ICD-10-CM

## 2017-02-07 DIAGNOSIS — Z8042 Family history of malignant neoplasm of prostate: Secondary | ICD-10-CM

## 2017-02-07 DIAGNOSIS — Z131 Encounter for screening for diabetes mellitus: Secondary | ICD-10-CM

## 2017-02-07 DIAGNOSIS — N139 Obstructive and reflux uropathy, unspecified: Secondary | ICD-10-CM | POA: Diagnosis not present

## 2017-02-07 DIAGNOSIS — Z85828 Personal history of other malignant neoplasm of skin: Secondary | ICD-10-CM | POA: Diagnosis not present

## 2017-02-07 DIAGNOSIS — Z136 Encounter for screening for cardiovascular disorders: Secondary | ICD-10-CM | POA: Diagnosis not present

## 2017-02-07 DIAGNOSIS — E663 Overweight: Secondary | ICD-10-CM | POA: Diagnosis not present

## 2017-02-07 DIAGNOSIS — Z7689 Persons encountering health services in other specified circumstances: Secondary | ICD-10-CM | POA: Diagnosis not present

## 2017-02-07 DIAGNOSIS — Z0000 Encounter for general adult medical examination without abnormal findings: Secondary | ICD-10-CM

## 2017-02-07 DIAGNOSIS — Z823 Family history of stroke: Secondary | ICD-10-CM | POA: Diagnosis not present

## 2017-02-07 LAB — CBC
HCT: 44.6 % (ref 38.5–50.0)
HEMOGLOBIN: 15.1 g/dL (ref 13.2–17.1)
MCH: 33.1 pg — AB (ref 27.0–33.0)
MCHC: 33.9 g/dL (ref 32.0–36.0)
MCV: 97.8 fL (ref 80.0–100.0)
MPV: 11.2 fL (ref 7.5–12.5)
Platelets: 231 10*3/uL (ref 140–400)
RBC: 4.56 MIL/uL (ref 4.20–5.80)
RDW: 12.6 % (ref 11.0–15.0)
WBC: 4.3 10*3/uL (ref 3.8–10.8)

## 2017-02-07 NOTE — Patient Instructions (Addendum)
-   Make an appointment with your dermatologist for annual skin check and to look at lesion on back of right thigh   Physical Activity Recommendations for modifying lipids and lowering blood pressure Engage in aerobic physical activity to reduce LDL-cholesterol, non-HDL-cholesterol, and blood pressure  Frequency: 3-4 sessions per week  Intensity: moderate to vigorous  Duration: 40 minutes on average  Physical Activity Recommendations for secondary prevention 1. Aerobic exercise  Frequency: 3-5 sessions per week  Intensity: 50-80% capacity  Duration: 20 - 60 minutes  Examples: walking, treadmill, cycling, rowing, stair climbing, and arm/leg ergometry  2. Resistance exercise  Frequency: 2-3 sessions per week  Intensity: 10-15 repetitions/set to moderate fatigue  Duration: 1-3 sets of 8-10 upper and lower body exercises  Examples: calisthenics, elastic bands, cuff/hand weights, dumbbels, free weights, wall pulleys, and weight machines  Heart-Healthy Lifestyle  Eating a diet rich in vegetables, fruits and whole grains: also includes low-fat dairy products, poultry, fish, legumes, and nuts; limit intake of sweets, sugar-sweetened beverages and red meats  Getting regular exercise  Maintaining a healthy weight  Not smoking or getting help quitting  Staying on top of your health; for some people, lifestyle changes alone may not be enough to prevent a heart attack or stroke. In these cases, taking a statin at the right dose will most likely be necessary

## 2017-02-07 NOTE — Progress Notes (Addendum)
HPI:                                                                Michael Callahan is a 46 y.o. male who presents to [facility] Primary Care [facility] Medicine today to establish care / annual physical exam  Current Concerns include none  Health Maintenance Health Maintenance  Topic Date Due  . HIV Screening  01/09/1984  . INFLUENZA VACCINE  01/24/2017  . TETANUS/TDAP  10/25/2024    Past Medical History:  Diagnosis Date  . Bilateral shoulder pain 12/05/2016  . Hyperglycemia 12/02/2014   A1c normal June 2016   . Prepatellar bursitis, right knee 09/04/2016  . Primary osteoarthritis of both hands 03/21/2016  . Seasonal allergies 03/24/2013   Past Surgical History:  Procedure Laterality Date  . HEAD & NECK SKIN LESION EXCISIONAL BIOPSY     Social History  Substance Use Topics  . Smoking status: Never Smoker  . Smokeless tobacco: Never Used  . Alcohol use Yes     Comment: <3 drinks per month   family history includes Alcoholism in his father; Brain cancer in his unknown relative; Dementia in his mother; Prostate cancer in his maternal grandfather; Stroke in his mother.  ROS: Review of Systems  Constitutional: Negative.   HENT: Negative.   Eyes: Positive for blurred vision (wears reading glasses).  Respiratory: Negative.   Cardiovascular: Negative.   Gastrointestinal: Negative.   Genitourinary: Positive for frequency and urgency.       + nocturia + post-void dribbling + hesitancy   Musculoskeletal: Positive for joint pain (b/l shoulders).  Neurological: Negative.   Endo/Heme/Allergies: Positive for environmental allergies.  Psychiatric/Behavioral: Negative.      Medications: Current Outpatient Prescriptions  Medication Sig Dispense Refill  . meloxicam (MOBIC) 15 MG tablet One tab PO qAM with breakfast for 2 weeks, then daily prn pain. 30 tablet 3   No current facility-administered medications for this visit.    Allergies  Allergen Reactions  .  Other Other (See Comments)    Band-aids  . Penicillins     REACTION: rash       Objective:  BP 119/74   Pulse 67   Wt 233 lb (105.7 kg)   BMI 29.92 kg/m  General Appearance:  Alert, cooperative, no distress, appropriate for age, overweight                            Head:  Normocephalic, without obvious abnormality                             Eyes:  PERRL, EOM's intact, conjunctiva and cornea clear,                              Ears:  TM pearly gray color and semitransparent, external ear canals normal, both ears                            Nose:  Nares symmetrical  Throat:  Lips, tongue, and mucosa are moist, pink, and intact; good dentition                             Neck:  Supple; symmetrical, trachea midline, no adenopathy; thyroid: no enlargement, symmetric, no tenderness/mass/nodules                             Back:  Symmetrical, no curvature, ROM normal                                      Lungs:  Clear to auscultation bilaterally, respirations unlabored                             Heart:  regular rate & normal rhythm, S1 and S2 normal, no murmurs, rubs, or gallops, no carotid bruit                     Abdomen:  Soft, non-tender, no mass or organomegaly              Genitourinary:  deferred         Musculoskeletal:  Tone and strength strong and symmetrical, all extremities; no joint pain or edema                                       Lymphatic:  No adenopathy             Skin/Hair/Nails:  Skin warm, dry and intact, no rashes, hypopigmented patch on right posterior neck, 3mm scaly papule on posterior right thigh                   Neurologic:  Alert and oriented x3, no cranial nerve deficits, sensation grossly intact, DTR's intact, normal gait and station, no tremor  No results found.  Depression screen PHQ 2/9 02/07/2017  Decreased Interest 0  Down, Depressed, Hopeless 0  PHQ - 2 Score 0     Assessment and Plan: 46 y.o. male with   1.  Encounter to establish care - reviewed PMH - reviewed health maintenance  2. Encounter for annual physical exam - negative PHQ2 - normal blood pressure - CBC - Comprehensive metabolic panel - Lipid Panel w/reflex Direct LDL  3. Overweight (BMI 25.0-29.9) - therapeutic lifestyle changes  4. History of nonmelanoma skin cancer - follow-up with dermatologist for annual skin check  5. Lower urinary tract obstructive syndrome - declines Flomax - Ambulatory referral to Urology  6. Family history of prostate cancer - declines PSA today. Plans to follow-up with Urology  7. Encounter for lipid screening for cardiovascular disease - Lipid Panel w/reflex Direct LDL  8. Screening for diabetes mellitus - Comprehensive metabolic panel  9. Family history of stroke or transient ischemic attack in mother  Patient education and anticipatory guidance given Patient agrees with treatment plan Follow-up in 1 year for CPE w/fasting labs or sooner as needed  Levonne Hubertharley E. Cummings PA-C

## 2017-02-08 ENCOUNTER — Encounter: Payer: Self-pay | Admitting: Physician Assistant

## 2017-02-08 DIAGNOSIS — E785 Hyperlipidemia, unspecified: Secondary | ICD-10-CM

## 2017-02-08 DIAGNOSIS — Z8042 Family history of malignant neoplasm of prostate: Secondary | ICD-10-CM | POA: Insufficient documentation

## 2017-02-08 DIAGNOSIS — E781 Pure hyperglyceridemia: Secondary | ICD-10-CM | POA: Insufficient documentation

## 2017-02-08 HISTORY — DX: Hyperlipidemia, unspecified: E78.5

## 2017-02-08 LAB — COMPREHENSIVE METABOLIC PANEL
ALBUMIN: 4.3 g/dL (ref 3.6–5.1)
ALT: 18 U/L (ref 9–46)
AST: 17 U/L (ref 10–40)
Alkaline Phosphatase: 51 U/L (ref 40–115)
BUN: 18 mg/dL (ref 7–25)
CALCIUM: 9.1 mg/dL (ref 8.6–10.3)
CHLORIDE: 106 mmol/L (ref 98–110)
CO2: 21 mmol/L (ref 20–32)
CREATININE: 1.04 mg/dL (ref 0.60–1.35)
Glucose, Bld: 96 mg/dL (ref 65–99)
POTASSIUM: 4.2 mmol/L (ref 3.5–5.3)
SODIUM: 140 mmol/L (ref 135–146)
Total Bilirubin: 0.6 mg/dL (ref 0.2–1.2)
Total Protein: 6.5 g/dL (ref 6.1–8.1)

## 2017-02-08 LAB — LIPID PANEL W/REFLEX DIRECT LDL
CHOL/HDL RATIO: 4.2 ratio (ref <5.0)
Cholesterol: 207 mg/dL — ABNORMAL HIGH (ref <200)
HDL: 49 mg/dL (ref >40)
LDL-CHOLESTEROL: 135 mg/dL — AB
NON-HDL CHOLESTEROL (CALC): 158 mg/dL — AB (ref <130)
TRIGLYCERIDES: 122 mg/dL (ref <150)

## 2017-02-08 NOTE — Progress Notes (Signed)
Hi Shariq,  Your labs look good: - normal kidney function - cholesterol in a healthy range: ideal LDL cholesterol is less than 100. Work on limiting saturated fats and regular cardiovascular exercise - normal blood counts, no evidence of anemia - no evidence of diabetes  Best, [employer] CorporationCharley

## 2017-04-19 ENCOUNTER — Other Ambulatory Visit: Payer: Self-pay | Admitting: Sports Medicine

## 2017-04-19 DIAGNOSIS — M25511 Pain in right shoulder: Principal | ICD-10-CM

## 2017-04-19 DIAGNOSIS — G8929 Other chronic pain: Secondary | ICD-10-CM

## 2017-04-19 DIAGNOSIS — M25512 Pain in left shoulder: Principal | ICD-10-CM

## 2017-06-12 DIAGNOSIS — J0100 Acute maxillary sinusitis, unspecified: Secondary | ICD-10-CM | POA: Diagnosis not present

## 2017-06-12 DIAGNOSIS — R05 Cough: Secondary | ICD-10-CM | POA: Diagnosis not present

## 2017-11-02 ENCOUNTER — Ambulatory Visit: Payer: BLUE CROSS/BLUE SHIELD | Admitting: Sports Medicine

## 2017-11-08 ENCOUNTER — Ambulatory Visit: Payer: BLUE CROSS/BLUE SHIELD | Admitting: Sports Medicine

## 2017-12-25 ENCOUNTER — Encounter: Payer: Self-pay | Admitting: Sports Medicine

## 2017-12-25 ENCOUNTER — Ambulatory Visit (INDEPENDENT_AMBULATORY_CARE_PROVIDER_SITE_OTHER): Payer: BLUE CROSS/BLUE SHIELD

## 2017-12-25 ENCOUNTER — Ambulatory Visit: Payer: BLUE CROSS/BLUE SHIELD | Admitting: Sports Medicine

## 2017-12-25 DIAGNOSIS — M25511 Pain in right shoulder: Secondary | ICD-10-CM

## 2017-12-25 DIAGNOSIS — G8929 Other chronic pain: Secondary | ICD-10-CM

## 2017-12-25 DIAGNOSIS — M25512 Pain in left shoulder: Secondary | ICD-10-CM

## 2017-12-25 MED ORDER — CELECOXIB 200 MG PO CAPS: ORAL_CAPSULE | ORAL | 2 refills | Status: DC
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
## 2017-12-25 NOTE — Assessment & Plan Note (Signed)
Acromioclavicular arthralgia on the right, injected today. Impingement syndrome on the left, rehab exercises given. Switching to Celebrex. Return to see me in 1 month, left subacromial injection versus MRI if no better.

## 2018-03-13 ENCOUNTER — Telehealth: Payer: Self-pay | Admitting: Sports Medicine

## 2018-03-13 ENCOUNTER — Ambulatory Visit: Payer: BLUE CROSS/BLUE SHIELD | Admitting: Sports Medicine

## 2018-03-13 ENCOUNTER — Encounter: Payer: Self-pay | Admitting: Sports Medicine

## 2018-03-13 ENCOUNTER — Ambulatory Visit (HOSPITAL_COMMUNITY)
Admission: RE | Admit: 2018-03-13 | Discharge: 2018-03-13 | Disposition: A | Discharge status: Home / Self Care | Payer: BLUE CROSS/BLUE SHIELD | Source: Ambulatory Visit | Attending: Sports Medicine | Admitting: Sports Medicine

## 2018-03-13 ENCOUNTER — Ambulatory Visit (INDEPENDENT_AMBULATORY_CARE_PROVIDER_SITE_OTHER): Payer: BLUE CROSS/BLUE SHIELD

## 2018-03-13 VITALS — BP 131/91 | HR 80 | Ht 74.0 in | Wt 243.0 lb

## 2018-03-13 DIAGNOSIS — M545 Low back pain, unspecified: Secondary | ICD-10-CM

## 2018-03-13 DIAGNOSIS — M5126 Other intervertebral disc displacement, lumbar region: Secondary | ICD-10-CM | POA: Insufficient documentation

## 2018-03-13 DIAGNOSIS — M5137 Other intervertebral disc degeneration, lumbosacral region: Secondary | ICD-10-CM

## 2018-03-13 DIAGNOSIS — Z23 Encounter for immunization: Secondary | ICD-10-CM | POA: Diagnosis not present

## 2018-03-13 DIAGNOSIS — M5418 Radiculopathy, sacral and sacrococcygeal region: Secondary | ICD-10-CM | POA: Insufficient documentation

## 2018-03-13 MED ORDER — PREDNISONE 50 MG PO TABS: ORAL_TABLET | ORAL | 0 refills | Status: DC

## 2018-03-13 NOTE — Progress Notes (Addendum)
Subjective:    I'm seeing this patient as a consultation for: Gena Fray, PA-C  CC: Low back pain  HPI: 4 days ago this pleasant 47 year old male was lifting weights, he was holding a dumbbell with his right hand and twisted to the left, felt a pop in his back and immediate pain, low back with radiation to the right buttock and hip.  Since then he has had worsening of the low back pain without overt radiculopathy, no groin numbness or tingling but he is starting to have increasing urination, sensation of incomplete emptying and leakage of urine in his pants.  I reviewed the past medical history, family history, social history, surgical history, and allergies today and no changes were needed.  Please see the problem list section below in epic for further details.  Past Medical History: Past Medical History:  Diagnosis Date  . Bilateral shoulder pain 12/05/2016  . Borderline hyperlipidemia 02/08/2017  . Hyperglycemia 12/02/2014   A1c normal June 2016   . Prepatellar bursitis, right knee 09/04/2016  . Primary osteoarthritis of both hands 03/21/2016  . Seasonal allergies 03/24/2013   Past Surgical History: Past Surgical History:  Procedure Laterality Date  . HEAD & NECK SKIN LESION EXCISIONAL BIOPSY     Social History: Social History   Socioeconomic History  . Marital status: Legally Separated    Spouse name: Not on file  . Number of children: Not on file  . Years of education: Not on file  . Highest education level: Not on file  Occupational History  . Not on file  Social Needs  . Financial resource strain: Not on file  . Food insecurity:    Worry: Not on file    Inability: Not on file  . Transportation needs:    Medical: Not on file    Non-medical: Not on file  Tobacco Use  . Smoking status: Never Smoker  . Smokeless tobacco: Never Used  Substance and Sexual Activity  . Alcohol use: Yes    Comment: <3 drinks per month  . Drug use: No  . Sexual activity: Yes   Partners: Female  Lifestyle  . Physical activity:    Days per week: Not on file    Minutes per session: Not on file  . Stress: Not on file  Relationships  . Social connections:    Talks on phone: Not on file    Gets together: Not on file    Attends religious service: Not on file    Active member of club or organization: Not on file    Attends meetings of clubs or organizations: Not on file    Relationship status: Not on file  Other Topics Concern  . Not on file  Social History Narrative  . Not on file   Family History: Family History  Problem Relation Age of Onset  . Alcoholism Father        grandfather  . Brain cancer Unknown        uncle  . Stroke Mother   . Dementia Mother   . Prostate cancer Maternal Grandfather    Allergies: Allergies  Allergen Reactions  . Other Other (See Comments)    Band-aids  . Penicillins     REACTION: rash   Medications: See med rec.  Review of Systems: No headache, visual changes, nausea, vomiting, diarrhea, constipation, dizziness, abdominal pain, skin rash, fevers, chills, night sweats, weight loss, swollen lymph nodes, body aches, joint swelling, muscle aches, chest pain, shortness of breath, mood  changes, visual or auditory hallucinations.   Objective:   General: Well Developed, well nourished, and in no acute distress.  Neuro:  Extra-ocular muscles intact, able to move all 4 extremities, sensation grossly intact.  Deep tendon reflexes tested were normal. Psych: Alert and oriented, mood congruent with affect. ENT:  Ears and nose appear unremarkable.  Hearing grossly normal. Neck: Unremarkable overall appearance, trachea midline.  No visible thyroid enlargement. Eyes: Conjunctivae and lids appear unremarkable.  Pupils equal and round. Skin: Warm and dry, no rashes noted.  Cardiovascular: Pulses palpable, no extremity edema. Back Exam:  Inspection: Unremarkable  Motion: Flexion 45 deg, Extension 45 deg, Side Bending to 45 deg  bilaterally,  Rotation to 45 deg bilaterally  SLR laying: Negative  XSLR laying: Negative  Palpable tenderness: None. FABER: negative. Sensory change: Gross sensation intact to all lumbar and sacral dermatomes.  Reflexes: 3+ at both patellar tendons, 3+ at achilles tendons, Babinski's downgoing.  Strength at foot  Plantar-flexion: 5/5 Dorsi-flexion: 5/5 Eversion: 5/5 Inversion: 5/5  Leg strength  Quad: 5/5 Hamstring: 5/5 Hip flexor: 5/5 Hip abductors: 5/5  Gait unremarkable.  Impression and Recommendations:   This case required medical decision making of moderate complexity.  Acute low back pain Severe acute low back pain with hyperreflexia. He is unfortunately having some urinary dysfunction with leaking in his pants that is new since his injury. Adding prednisone 50 mg daily for 5 days, x-rays today and he does need an MRI today. Obvious concern is cauda equina syndrome.  Still awaiting radiologist over-read but personally reviewed MRI, noted some facet arthropathy and retrolisthesis of L5 on S1, broad disc extrusion with loss of height at L5-S1, no anatomical findings that would cause cauda equina syndrome and only mild bilateral L5-S1 foraminal narrowing without nerve root compression.  Discussed these findings with the patient and we will continue with conservative management.  Patient understands and agrees, will touch base in a few weeks in the office.   ___________________________________________ Ihor Austinhomas J. Benjamin Stainhekkekandam, M.D., ABFM., CAQSM. Primary Care and Sports Medicine [facility]  Adjunct Instructor of Family Medicine  [employer]

## 2018-03-13 NOTE — Assessment & Plan Note (Addendum)
Severe acute low back pain with hyperreflexia. He is unfortunately having some urinary dysfunction with leaking in his pants that is new since his injury. Adding prednisone 50 mg daily for 5 days, x-rays today and he does need an MRI today. Obvious concern is cauda equina syndrome.  Still awaiting radiologist over-read but personally reviewed MRI, noted some facet arthropathy and retrolisthesis of L5 on S1, broad disc extrusion with loss of height at L5-S1, no anatomical findings that would cause cauda equina syndrome and only mild bilateral L5-S1 foraminal narrowing without nerve root compression.  Discussed these findings with the patient and we will continue with conservative management.  Patient understands and agrees, will touch base in a few weeks in the office.

## 2018-03-13 NOTE — Telephone Encounter (Signed)
Still awaiting radiologist over-read but personally reviewed MRI, noted some facet arthropathy and retrolisthesis of L5 on S1, broad disc extrusion with loss of height at L5-S1, no anatomical findings that would cause cauda equina syndrome and only mild bilateral L5-S1 foraminal narrowing without nerve root compression.  Discussed these findings with the patient and we will continue with conservative management.  Patient understands and agrees, will touch base in a few weeks in the office.

## 2018-03-21 DIAGNOSIS — L708 Other acne: Secondary | ICD-10-CM | POA: Diagnosis not present

## 2018-03-21 DIAGNOSIS — Z85828 Personal history of other malignant neoplasm of skin: Secondary | ICD-10-CM | POA: Diagnosis not present

## 2018-03-21 DIAGNOSIS — Z09 Encounter for follow-up examination after completed treatment for conditions other than malignant neoplasm: Secondary | ICD-10-CM | POA: Diagnosis not present

## 2018-03-21 DIAGNOSIS — Z08 Encounter for follow-up examination after completed treatment for malignant neoplasm: Secondary | ICD-10-CM | POA: Diagnosis not present

## 2018-05-07 ENCOUNTER — Other Ambulatory Visit: Payer: Self-pay | Admitting: Sports Medicine

## 2018-05-07 DIAGNOSIS — M25512 Pain in left shoulder: Principal | ICD-10-CM

## 2018-05-07 DIAGNOSIS — G8929 Other chronic pain: Secondary | ICD-10-CM

## 2018-05-07 DIAGNOSIS — M25511 Pain in right shoulder: Principal | ICD-10-CM

## 2018-06-25 ENCOUNTER — Ambulatory Visit: Payer: BLUE CROSS/BLUE SHIELD | Admitting: Sports Medicine

## 2018-06-25 ENCOUNTER — Encounter: Payer: Self-pay | Admitting: Sports Medicine

## 2018-06-25 DIAGNOSIS — M25512 Pain in left shoulder: Secondary | ICD-10-CM | POA: Diagnosis not present

## 2018-06-25 DIAGNOSIS — M25511 Pain in right shoulder: Secondary | ICD-10-CM

## 2018-06-25 DIAGNOSIS — G8929 Other chronic pain: Secondary | ICD-10-CM | POA: Diagnosis not present

## 2018-06-25 MED ORDER — CELECOXIB 200 MG PO CAPS: ORAL_CAPSULE | ORAL | 3 refills | Status: DC

## 2018-06-25 NOTE — Assessment & Plan Note (Signed)
Bilateral acromioclavicular pain. Previous injection on the right was almost 6 months ago with fantastic relief. Bilateral acromioclavicular injections and a right subacromial injection. Celebrex seemed to control his pain well once it was controlled after injection.

## 2018-06-25 NOTE — Progress Notes (Signed)
Subjective:    CC: Bilateral shoulder pain  HPI: This is a pleasant 47 year old male, we have treated his right acromioclavicular joint 6 months ago, he did well until recently, Celebrex is also extremely beneficial but he ran out.  He has similar pain on the left side.  Severe, persistent, localized without radiation.  I reviewed the past medical history, family history, social history, surgical history, and allergies today and no changes were needed.  Please see the problem list section below in epic for further details.  Past Medical History: Past Medical History:  Diagnosis Date  . Bilateral shoulder pain 12/05/2016  . Borderline hyperlipidemia 02/08/2017  . Hyperglycemia 12/02/2014   A1c normal June 2016   . Prepatellar bursitis, right knee 09/04/2016  . Primary osteoarthritis of both hands 03/21/2016  . Seasonal allergies 03/24/2013   Past Surgical History: Past Surgical History:  Procedure Laterality Date  . HEAD & NECK SKIN LESION EXCISIONAL BIOPSY     Social History: Social History   Socioeconomic History  . Marital status: Legally Separated    Spouse name: Not on file  . Number of children: Not on file  . Years of education: Not on file  . Highest education level: Not on file  Occupational History  . Not on file  Social Needs  . Financial resource strain: Not on file  . Food insecurity:    Worry: Not on file    Inability: Not on file  . Transportation needs:    Medical: Not on file    Non-medical: Not on file  Tobacco Use  . Smoking status: Never Smoker  . Smokeless tobacco: Never Used  Substance and Sexual Activity  . Alcohol use: Yes    Comment: <3 drinks per month  . Drug use: No  . Sexual activity: Yes    Partners: Female  Lifestyle  . Physical activity:    Days per week: Not on file    Minutes per session: Not on file  . Stress: Not on file  Relationships  . Social connections:    Talks on phone: Not on file    Gets together: Not on file   Attends religious service: Not on file    Active member of club or organization: Not on file    Attends meetings of clubs or organizations: Not on file    Relationship status: Not on file  Other Topics Concern  . Not on file  Social History Narrative  . Not on file   Family History: Family History  Problem Relation Age of Onset  . Alcoholism Father        grandfather  . Brain cancer Unknown        uncle  . Stroke Mother   . Dementia Mother   . Prostate cancer Maternal Grandfather    Allergies: Allergies  Allergen Reactions  . Other Other (See Comments)    Band-aids  . Penicillins     REACTION: rash   Medications: See med rec.  Review of Systems: No fevers, chills, night sweats, weight loss, chest pain, or shortness of breath.   Objective:    General: Well Developed, well nourished, and in no acute distress.  Neuro: Alert and oriented x3, extra-ocular muscles intact, sensation grossly intact.  HEENT: Normocephalic, atraumatic, pupils equal round reactive to light, neck supple, no masses, no lymphadenopathy, thyroid nonpalpable.  Skin: Warm and dry, no rashes. Cardiac: Regular rate and rhythm, no murmurs rubs or gallops, no lower extremity edema.  Respiratory: Clear to  auscultation bilaterally. Not using accessory muscles, speaking in full sentences. Bilateral shoulders: Inspection reveals no abnormalities, atrophy or asymmetry. Tender to palpation over the right greater than left acromioclavicular joint. ROM is full in all planes. Rotator cuff strength normal throughout. Positive Neer and Hawkin's tests, empty can on the right. Speeds and Yergason's tests normal. No labral pathology noted with negative Obrien's, negative crank, negative clunk, and good stability. Normal scapular function observed. No painful arc and no drop arm sign. No apprehension sign  Procedure: Real-time Ultrasound Guided Injection of right acromioclavicular joint Device: GE Logiq E  Verbal  informed consent obtained.  Time-out conducted.  Noted no overlying erythema, induration, or other signs of local infection.  Skin prepped in a sterile fashion.  Local anesthesia: Topical Ethyl chloride.  With sterile technique and under real time ultrasound guidance: 1/2 cc Kenalog 40, 1/2 cc lidocaine injected easily Completed without difficulty  Pain immediately resolved suggesting accurate placement of the medication.  Advised to call if fevers/chills, erythema, induration, drainage, or persistent bleeding.  Images permanently stored and available for review in the ultrasound unit.  Impression: Technically successful ultrasound guided injection.  Procedure: Real-time Ultrasound Guided Injection of left acromioclavicular joint Device: GE Logiq E  Verbal informed consent obtained.  Time-out conducted.  Noted no overlying erythema, induration, or other signs of local infection.  Skin prepped in a sterile fashion.  Local anesthesia: Topical Ethyl chloride.  With sterile technique and under real time ultrasound guidance: 1/2 cc Kenalog 40, 1/2 cc lidocaine injected easily Completed without difficulty  Pain immediately resolved suggesting accurate placement of the medication.  Advised to call if fevers/chills, erythema, induration, drainage, or persistent bleeding.  Images permanently stored and available for review in the ultrasound unit.  Impression: Technically successful ultrasound guided injection.  Procedure: Real-time Ultrasound Guided Injection of right subacromial bursa Device: GE Logiq E  Verbal informed consent obtained.  Time-out conducted.  Noted no overlying erythema, induration, or other signs of local infection.  Skin prepped in a sterile fashion.  Local anesthesia: Topical Ethyl chloride.  With sterile technique and under real time ultrasound guidance: Noted intact rotator cuff, 1 cc Kenalog 40, 1 cc lidocaine, 1 cc bupivacaine injected easily Completed without  difficulty  Pain immediately resolved suggesting accurate placement of the medication.  Advised to call if fevers/chills, erythema, induration, drainage, or persistent bleeding.  Images permanently stored and available for review in the ultrasound unit.  Impression: Technically successful ultrasound guided injection.  Impression and Recommendations:    Bilateral shoulder pain Bilateral acromioclavicular pain. Previous injection on the right was almost 6 months ago with fantastic relief. Bilateral acromioclavicular injections and a right subacromial injection. Celebrex seemed to control his pain well once it was controlled after injection. ___________________________________________ Ihor Austinhomas J. Benjamin Stainhekkekandam, M.D., ABFM., CAQSM. Primary Care and Sports Medicine [facility]  Adjunct Professor of Family Medicine  [employer]

## 2018-08-06 ENCOUNTER — Ambulatory Visit: Payer: BLUE CROSS/BLUE SHIELD | Admitting: Sports Medicine

## 2018-08-11 IMAGING — DX DG SHOULDER 2+V*L*
3 series · 3 of 3 positions shown · non-contrast
Comparison: None.

CLINICAL DATA: Bilateral shoulder pain for 3 weeks

EXAM:
LEFT SHOULDER - 2+ VIEW

[shoulder grashey]
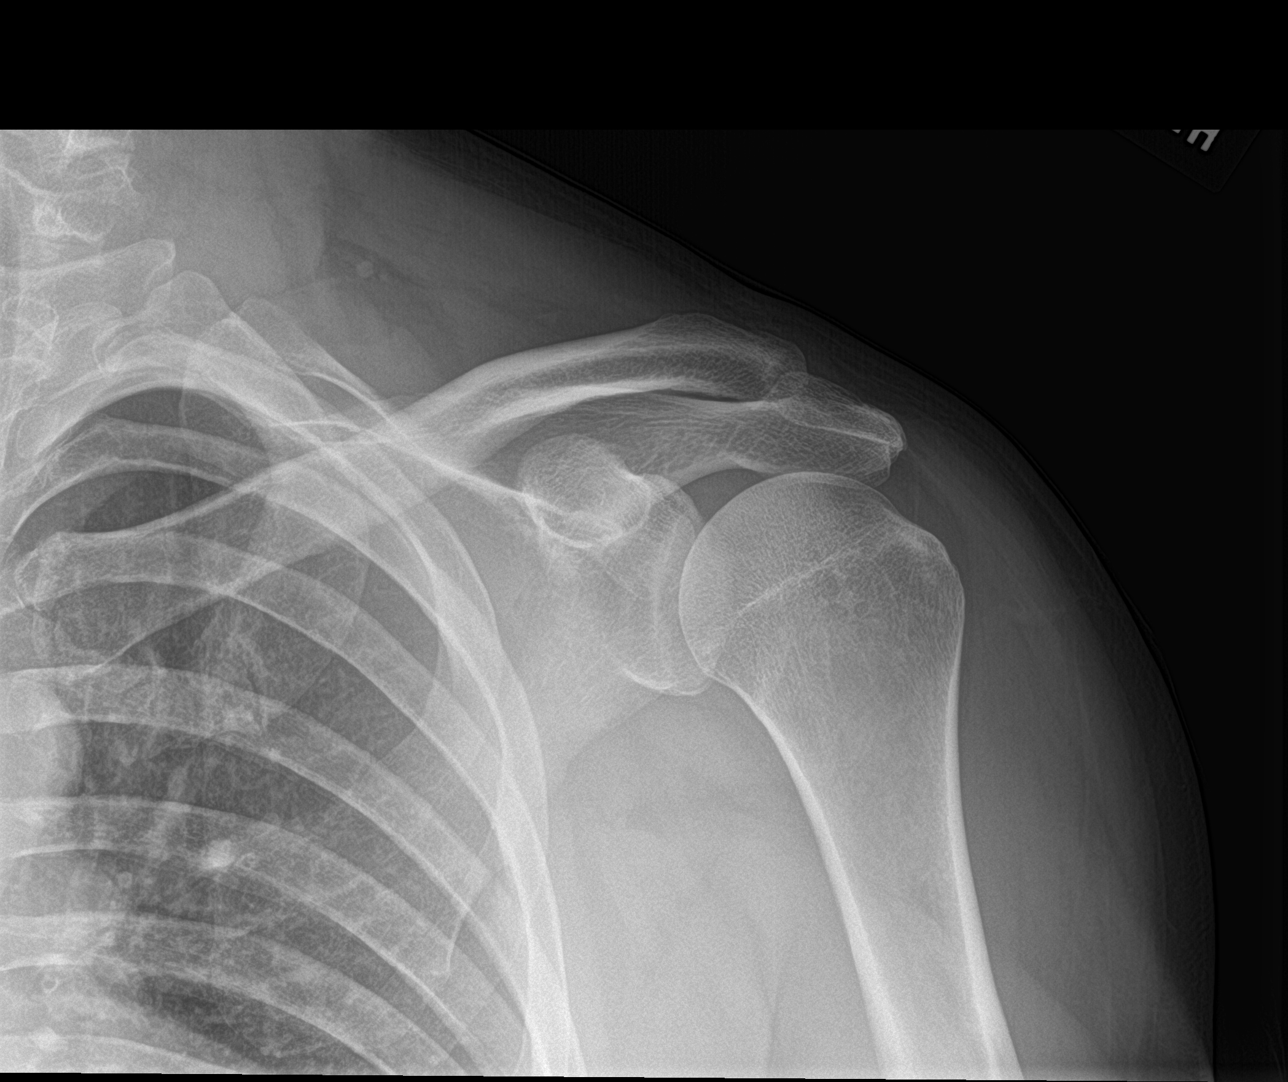

[shoulder y view]
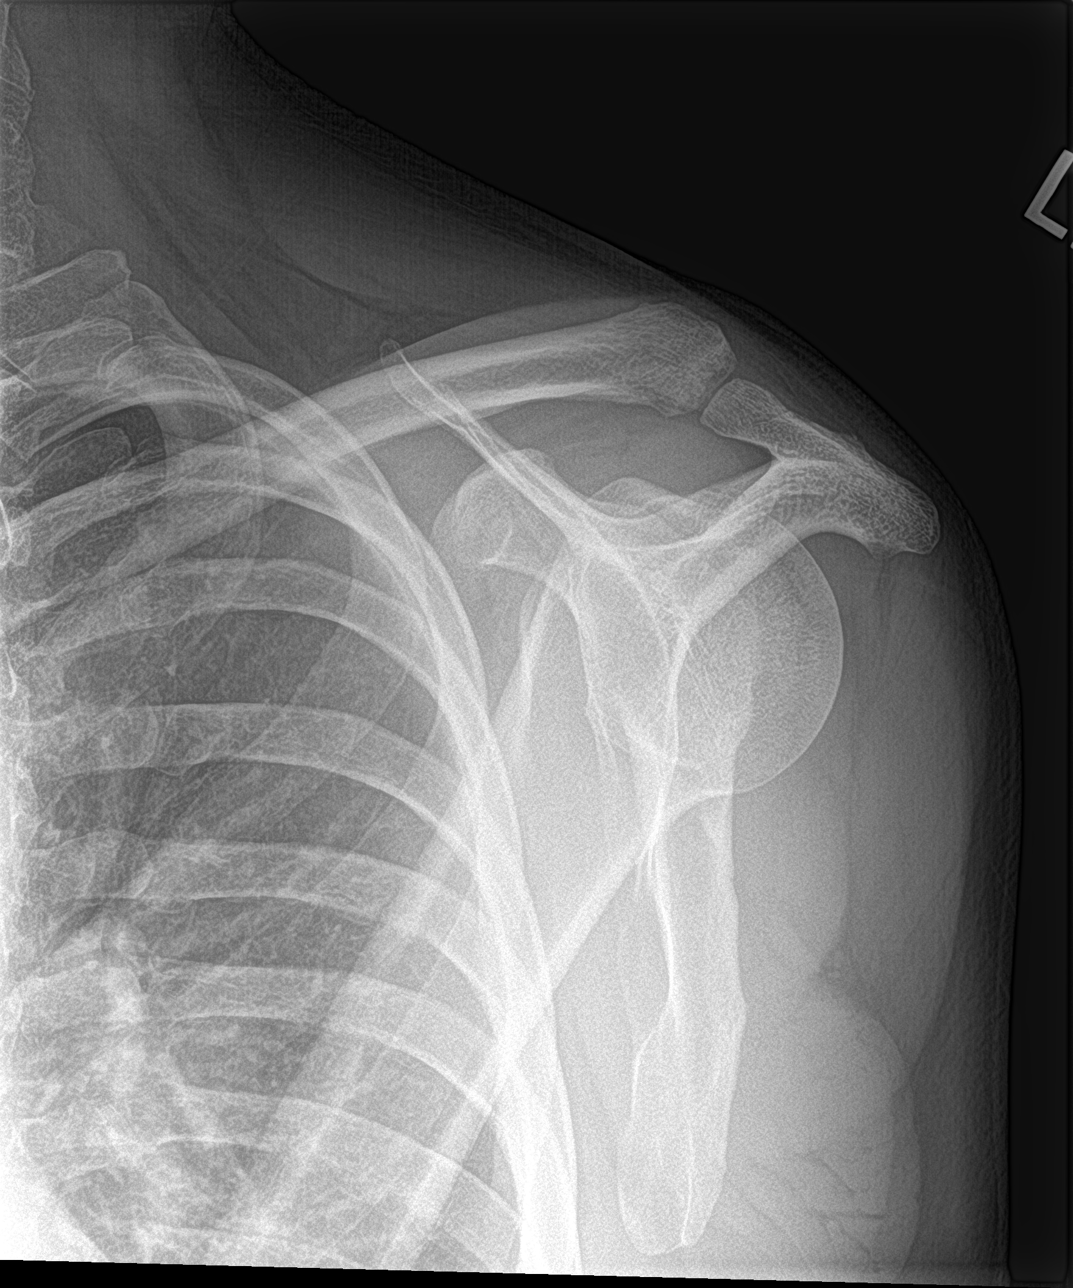

[shoulder axillary]
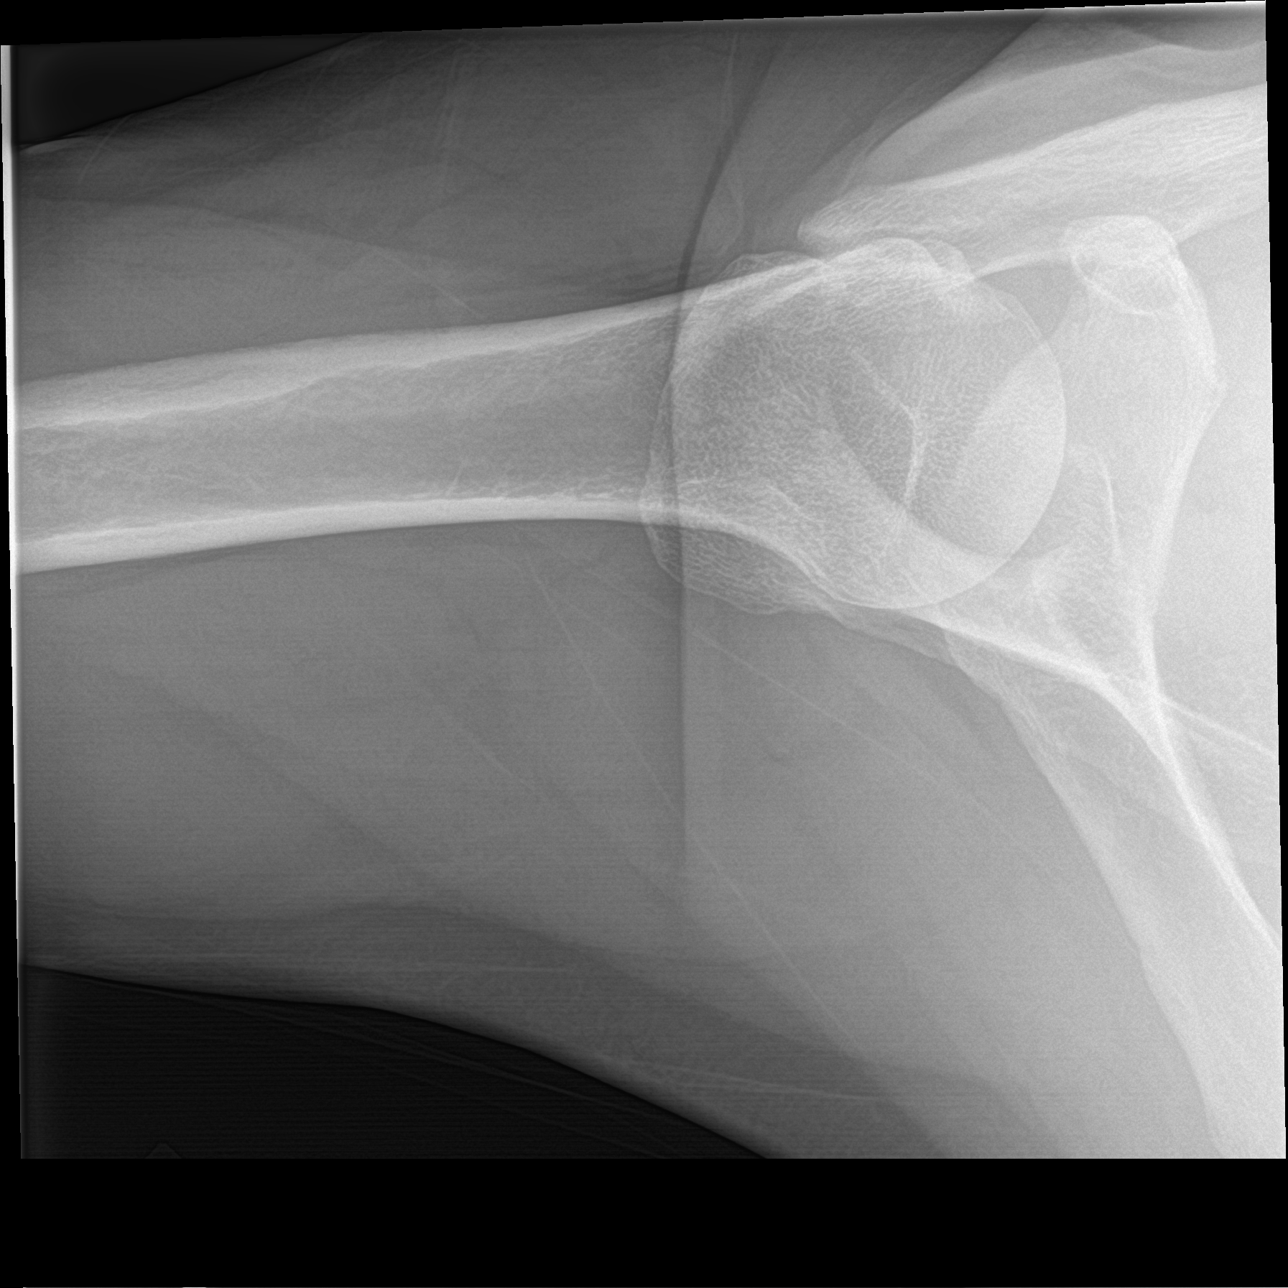

[3 of 3 positions shown; findings below may reference images not displayed]

FINDINGS: No acute fracture. No dislocation.  Unremarkable soft tissues.
IMPRESSION: No acute bony pathology.

## 2019-01-24 ENCOUNTER — Encounter: Payer: Self-pay | Admitting: Physician Assistant

## 2019-01-24 ENCOUNTER — Ambulatory Visit (INDEPENDENT_AMBULATORY_CARE_PROVIDER_SITE_OTHER): Payer: BC Managed Care – PPO | Admitting: Physician Assistant

## 2019-01-24 ENCOUNTER — Other Ambulatory Visit: Payer: Self-pay

## 2019-01-24 VITALS — BP 126/86 | HR 64 | Temp 97.6°F | Wt 240.0 lb

## 2019-01-24 DIAGNOSIS — Z8042 Family history of malignant neoplasm of prostate: Secondary | ICD-10-CM

## 2019-01-24 DIAGNOSIS — Z85828 Personal history of other malignant neoplasm of skin: Secondary | ICD-10-CM | POA: Diagnosis not present

## 2019-01-24 DIAGNOSIS — E6609 Other obesity due to excess calories: Secondary | ICD-10-CM | POA: Insufficient documentation

## 2019-01-24 DIAGNOSIS — Z0000 Encounter for general adult medical examination without abnormal findings: Secondary | ICD-10-CM

## 2019-01-24 DIAGNOSIS — I10 Essential (primary) hypertension: Secondary | ICD-10-CM

## 2019-01-24 DIAGNOSIS — E785 Hyperlipidemia, unspecified: Secondary | ICD-10-CM | POA: Diagnosis not present

## 2019-01-24 DIAGNOSIS — Z1211 Encounter for screening for malignant neoplasm of colon: Secondary | ICD-10-CM

## 2019-01-24 DIAGNOSIS — Z1389 Encounter for screening for other disorder: Secondary | ICD-10-CM | POA: Diagnosis not present

## 2019-01-24 DIAGNOSIS — Z6830 Body mass index (BMI) 30.0-30.9, adult: Secondary | ICD-10-CM

## 2019-01-24 DIAGNOSIS — Z130 Encounter for screening for diseases of the blood and blood-forming organs and certain disorders involving the immune mechanism: Secondary | ICD-10-CM

## 2019-01-24 NOTE — Progress Notes (Signed)
HPI:                                                                Michael Callahan is a 48 y.o. male who presents to [facility]: [place] today for annual physical exam  Current Concerns include: referral  Requesting referral to derm for skin check and "sun spots" on his temple areas. Hx of non-melanoma skin cancer treated with cryotherapy and excision in the past.  Reports his urinary symptoms have been better. He typically has nocturia once per night around 3 am. Admits he drinks 32-36 ounces of water every evening. Notes frequent urination after drinking this much water. Denies weak stream, incomplete bladder emptying, dysuria.  Health Maintenance Health Maintenance  Topic Date Due  . HIV Screening  01/09/1984  . COLONOSCOPY  01/09/2019  . INFLUENZA VACCINE  01/25/2019  . TETANUS/TDAP  10/25/2024     Health Habits  Diet: transitioning to plant based  Exercise: mountain biking 2-3 days per week  ETOH: social, 2-4/month  Tobacco: never  Drugs: never  Past Medical History:  Diagnosis Date  . Bilateral shoulder pain 12/05/2016  . Borderline hyperlipidemia 02/08/2017  . Hyperglycemia 12/02/2014   A1c normal June 2016   . Hypertension   . Prepatellar bursitis, right knee 09/04/2016  . Primary osteoarthritis of both hands 03/21/2016  . Seasonal allergies 03/24/2013   Past Surgical History:  Procedure Laterality Date  . HEAD & NECK SKIN LESION EXCISIONAL BIOPSY     Social History   Tobacco Use  . Smoking status: Never Smoker  . Smokeless tobacco: Never Used  Substance Use Topics  . Alcohol use: Yes    Comment: <3 drinks per month   family history includes Alcoholism in his father; Brain cancer in an other family member; Dementia in his mother; Prostate cancer in his maternal grandfather; Stroke in his mother.  ROS: negative except as noted in the HPI  Medications: No current outpatient medications on file.   No current  facility-administered medications for this visit.    Allergies  Allergen Reactions  . Other Other (See Comments)    Band-aids  . Doxycycline Nausea And Vomiting  . Penicillins     REACTION: rash       Objective:  BP 126/86   Pulse 64   Temp 97.6 F (36.4 C) (Oral)   Wt 240 lb (108.9 kg)   BMI 30.81 kg/m   Wt Readings from Last 3 Encounters:  01/24/19 240 lb (108.9 kg)  06/25/18 251 lb (113.9 kg)  03/13/18 243 lb (110.2 kg)   Temp Readings from Last 3 Encounters:  01/24/19 97.6 F (36.4 C) (Oral)  08/17/14 97.8 F (36.6 C) (Oral)  07/20/14 97.6 F (36.4 C) (Oral)   BP Readings from Last 3 Encounters:  01/24/19 126/86  06/25/18 128/83  03/13/18 (!) 131/91   Pulse Readings from Last 3 Encounters:  01/24/19 64  06/25/18 69  03/13/18 80    General Appearance:  Alert, cooperative, no distress, appropriate for age, obese male                            Head:  Normocephalic, without obvious abnormality  Eyes:  PERRL, EOM's intact, conjunctiva and cornea clear                             Ears:  TM pearly gray color and semitransparent, external ear canals normal, both ears                            Nose:  Nares symmetrical, mucosa pink                          Throat:  Lips, tongue, and mucosa are moist, pink, and intact; oropharynx clear, uvula midline; good dentition                             Neck:  Supple; symmetrical, trachea midline, no adenopathy; thyroid: no enlargement, symmetric, no tenderness/mass/nodules                             Back:  Symmetrical, no curvature, ROM normal                                      Lungs:  Clear to auscultation bilaterally, respirations unlabored                             Heart:  normal rate & regular rhythm, S1 and S2 normal, no murmurs, rubs, or gallops                     Abdomen:  Soft, non-tender, no mass or organomegaly              Genitourinary:  deferred         Musculoskeletal:  Tone  and strength strong and symmetrical, all extremities; no joint pain or edema, normal gait and station                                    Lymphatic:  No adenopathy             Skin/Hair/Nails:  Skin warm, dry and intact, no rashes or abnormal dyspigmentation on limited exam                   Neurologic:  Alert and oriented x3, no cranial nerve deficits, DTR's intact, sensation grossly intact, normal gait and station, no tremor Psych: well-groomed, cooperative, good eye contact, euthymic mood, affect mood-congruent, speech is articulate, and thought processes clear and goal-directed  Lab Results  Component Value Date   CREATININE 1.04 02/07/2017   BUN 18 02/07/2017   NA 140 02/07/2017   K 4.2 02/07/2017   CL 106 02/07/2017   CO2 21 02/07/2017   Lab Results  Component Value Date   ALT 18 02/07/2017   AST 17 02/07/2017   ALKPHOS 51 02/07/2017   BILITOT 0.6 02/07/2017   Lab Results  Component Value Date   HGBA1C 5.2 01/13/2016   Lab Results  Component Value Date   CHOL 207 (H) 02/07/2017   HDL 49 02/07/2017   LDLCALC 112 (H) 12/01/2014   TRIG 122 02/07/2017   CHOLHDL 4.2  02/07/2017   The 10-year ASCVD risk score Denman George(Goff DC Montez HagemanJr., et al., 2013) is: 3.6%   Values used to calculate the score:     Age: [age] years     Sex: Male     Is Non-Hispanic African American: No     Diabetic: No     Tobacco smoker: No     Systolic Blood Pressure: 126 mmHg     Is BP treated: No     HDL Cholesterol: 49 mg/dL     Total Cholesterol: 207 mg/dL   Assessment and Plan: 48 y.o. male with   .[name] was seen today for annual exam.  Diagnoses and all orders for this visit:  Encounter for annual physical exam -     CBC -     COMPLETE METABOLIC PANEL WITH GFR -     Lipid Panel w/reflex Direct LDL -     Urinalysis, Routine w reflex microscopic  Borderline hyperlipidemia -     Lipid Panel w/reflex Direct LDL  History of nonmelanoma skin cancer -     Ambulatory referral to Dermatology  Family  history of prostate cancer  Hypertension goal BP (blood pressure) < 130/80 -     COMPLETE METABOLIC PANEL WITH GFR -     Urinalysis, Routine w reflex microscopic  Screening for blood disease -     CBC  Screening for blood or protein in urine -     Urinalysis, Routine w reflex microscopic  Colon cancer screening -     Cologuard  Class 1 obesity due to excess calories with serious comorbidity and body mass index (BMI) of 30.0 to 30.9 in adult   - Personally reviewed PMH, PSH, PFH, medications, allergies, HM - Age-appropriate cancer screening: discussed colon cancer screening, he is average risk, Cologuard ordered; baseline PSA age 48 - Tdap UTD - PHQ2 negative - Fall screen negative - BMI 30 - counseled on weight loss through decreased caloric intake and increased aerobic exercise   HTN 10 yr ASCVD risk 3.6% Patient to monitor and log BP's at home for the next month Counseled on therapeutic lifestyle changes Renal function and fasting lipids pending  Patient education and anticipatory guidance given Patient agrees with treatment plan Follow-up in 1 month for HTN or sooner as needed  Levonne Hubertharley E. Cummings PA-C

## 2019-01-24 NOTE — Patient Instructions (Addendum)
For your blood pressure: - Goal <130/80 (Ideally 120's/70's) - monitor and log blood pressures at home - check around the same time each day in a relaxed setting - Limit salt to <2500 mg/day - Follow DASH (Dietary Approach to Stopping Hypertension) eating plan - Try to get at least 150 minutes of aerobic exercise per week - Aim to go on a brisk walk 30 minutes per day at least 5 days per week. If you're not active, gradually increase how long you walk by 5 minutes each week - limit alcohol: 2 standard drinks per day for men and 1 per day for women - avoid tobacco/nicotine products. Consider smoking cessation if you smoke - weight loss: 7% of current body weight can reduce your blood pressure by 5-10 points - follow-up at least every 6 months for your blood pressure. Follow-up sooner if your BP is not controlled    Colorectal Cancer Screening  Colorectal cancer screening is a group of tests that are used to check for colorectal cancer before symptoms develop. Colorectal refers to the colon and rectum. The colon and rectum are located at the end of the digestive tract and carry bowel movements out of the body. Who should have screening? All adults starting at age 49 until age 68 should have screening. Your health care provider may recommend screening at age 25. You will have tests every 1-10 years, depending on your results and the type of screening test. You may have screening tests starting at an earlier age, or more frequently than other people, if you have any of the following risk factors:  A personal or family history of colorectal cancer or abnormal growths (polyps).  Inflammatory bowel disease, such as ulcerative colitis or Crohn's disease.  A history of having radiation treatment to the abdomen or pelvic area for cancer.  Colorectal cancer symptoms, such as changes in bowel habits or blood in your stool.  A type of colon cancer syndrome that is passed from parent to child  (hereditary), such as: ? Lynch syndrome. ? Familial adenomatous polyposis. ? Turcot syndrome. ? Peutz-Jeghers syndrome. Screening recommendations for adults who are 7-4 years old vary depending on health. How is screening done? There are several types of colorectal screening tests. You may have one or more of the following:  Guaiac-based fecal occult blood testing. For this test, a stool (feces) sample is checked for hidden (occult) blood, which could be a sign of colorectal cancer.  Fecal immunochemical test (FIT). For this test, a stool sample is checked for blood, which could be a sign of colorectal cancer.  Stool DNA test. For this test, a stool sample is checked for blood and changes in DNA that could lead to colorectal cancer.  Sigmoidoscopy. During this test, a thin, flexible tube with a camera on the end (sigmoidoscope) is used to examine the rectum and the lower colon.  Colonoscopy. During this test, a long, flexible tube with a camera on the end (colonoscope) is used to examine the entire colon and rectum. With a colonoscopy, it is possible to take a sample of tissue (biopsy) and remove small polyps during the test.  Virtual colonoscopy. Instead of a colonoscope, this type of colonoscopy uses X-rays (CT scan) and computers to produce images of the colon and rectum. What are the benefits of screening? Screening reduces your risk for colorectal cancer and can help identify cancer at an early stage, when the cancer can be removed or treated more easily. It is common for polyps  to form in the lining of the colon, especially as you age. These polyps may be cancerous or become cancerous over time. Screening can identify these polyps. What are the risks of screening? Each screening test may have different risks.  Stool sample tests have fewer risks than other types of screening tests. However, you may need more tests to confirm results from a stool sample test.  Screening tests that  involve X-rays expose you to low levels of radiation, which may slightly increase your cancer risk. The benefit of detecting cancer outweighs the slight increase in risk.  Screening tests such as sigmoidoscopy and colonoscopy may place you at risk for bleeding, intestinal damage, infection, or a reaction to medicines given during the exam. Talk with your health care provider to understand your risk for colorectal cancer and to make a screening plan that is right for you. Questions to ask your health care provider  When should I start colorectal cancer screening?  What is my risk for colorectal cancer?  How often do I need screening?  Which screening tests do I need?  How do I get my test results?  What do my results mean? Where to find more information Learn more about colorectal cancer screening from:  The [place]: www.cancer.org  The [employer]: www.cancer.gov Summary  Colorectal cancer screening is a group of tests used to check for colorectal cancer before symptoms develop.  Screening reduces your risk for colorectal cancer and can help identify cancer at an early stage, when the cancer can be removed or treated more easily.  All adults starting at age 64 until age 65 should have screening. Your health care provider may recommend screening at age 25.  You may have screening tests starting at an earlier age, or more frequently than other people, if you have certain risk factors.  Talk with your health care provider to understand your risk for colorectal cancer and to make a screening plan that is right for you. This information is not intended to replace advice given to you by your health care provider. Make sure you discuss any questions you have with your health care provider. Document Released: 11/30/2009 Document Revised: 10/02/2018 Document Reviewed: 03/14/2017 Elsevier Patient Education  2020 [place] Years  Old, Male Preventive care refers to lifestyle choices and visits with your health care provider that can promote health and wellness. This includes:  A yearly physical exam. This is also called an annual well check.  Regular dental and eye exams.  Immunizations.  Screening for certain conditions.  Healthy lifestyle choices, such as eating a healthy diet, getting regular exercise, not using drugs or products that contain nicotine and tobacco, and limiting alcohol use. What can I expect for my preventive care visit? Physical exam Your health care provider will check:  Height and weight. These may be used to calculate body mass index (BMI), which is a measurement that tells if you are at a healthy weight.  Heart rate and blood pressure.  Your skin for abnormal spots. Counseling Your health care provider may ask you questions about:  Alcohol, tobacco, and drug use.  Emotional well-being.  Home and relationship well-being.  Sexual activity.  Eating habits.  Work and work Statistician. What immunizations do I need?  Influenza (flu) vaccine  This is recommended every year. Tetanus, diphtheria, and pertussis (Tdap) vaccine  You may need a Td booster every 10 years. Varicella (chickenpox) vaccine  You may need this  vaccine if you have not already been vaccinated. Zoster (shingles) vaccine  You may need this after age 53. Measles, mumps, and rubella (MMR) vaccine  You may need at least one dose of MMR if you were born in 1957 or later. You may also need a second dose. Pneumococcal conjugate (PCV13) vaccine  You may need this if you have certain conditions and were not previously vaccinated. Pneumococcal polysaccharide (PPSV23) vaccine  You may need one or two doses if you smoke cigarettes or if you have certain conditions. Meningococcal conjugate (MenACWY) vaccine  You may need this if you have certain conditions. Hepatitis A vaccine  You may need this if you have  certain conditions or if you travel or work in places where you may be exposed to hepatitis A. Hepatitis B vaccine  You may need this if you have certain conditions or if you travel or work in places where you may be exposed to hepatitis B. Haemophilus influenzae type b (Hib) vaccine  You may need this if you have certain risk factors. Human papillomavirus (HPV) vaccine  If recommended by your health care provider, you may need three doses over 6 months. You may receive vaccines as individual doses or as more than one vaccine together in one shot (combination vaccines). Talk with your health care provider about the risks and benefits of combination vaccines. What tests do I need? Blood tests  Lipid and cholesterol levels. These may be checked every 5 years, or more frequently if you are over 15 years old.  Hepatitis C test.  Hepatitis B test. Screening  Lung cancer screening. You may have this screening every year starting at age 43 if you have a 30-pack-year history of smoking and currently smoke or have quit within the past 15 years.  Prostate cancer screening. Recommendations will vary depending on your family history and other risks.  Colorectal cancer screening. All adults should have this screening starting at age 46 and continuing until age 44. Your health care provider may recommend screening at age 68 if you are at increased risk. You will have tests every 1-10 years, depending on your results and the type of screening test.  Diabetes screening. This is done by checking your blood sugar (glucose) after you have not eaten for a while (fasting). You may have this done every 1-3 years.  Sexually transmitted disease (STD) testing. Follow these instructions at home: Eating and drinking  Eat a diet that includes fresh fruits and vegetables, whole grains, lean protein, and low-fat dairy products.  Take vitamin and mineral supplements as recommended by your health care provider.   Do not drink alcohol if your health care provider tells you not to drink.  If you drink alcohol: ? Limit how much you have to 0-2 drinks a day. ? Be aware of how much alcohol is in your drink. In the [place] one drink equals one 12 oz bottle of beer (355 mL), one 5 oz glass of wine (148 mL), or one 1 oz glass of hard liquor (44 mL). Lifestyle  Take daily care of your teeth and gums.  Stay active. Exercise for at least 30 minutes on 5 or more days each week.  Do not use any products that contain nicotine or tobacco, such as cigarettes, e-cigarettes, and chewing tobacco. If you need help quitting, ask your health care provider.  If you are sexually active, practice safe sex. Use a condom or other form of protection to prevent STIs (sexually transmitted infections).  Talk with your health care provider about taking a low-dose aspirin every day starting at age 71. What's next?  Go to your health care provider once a year for a well check visit.  Ask your health care provider how often you should have your eyes and teeth checked.  Stay up to date on all vaccines. This information is not intended to replace advice given to you by your health care provider. Make sure you discuss any questions you have with your health care provider. Document Released: 07/09/2015 Document Revised: 06/06/2018 Document Reviewed: 06/06/2018 Elsevier Patient Education  2020 [employer].

## 2019-01-25 LAB — LIPID PANEL W/REFLEX DIRECT LDL
Cholesterol: 216 mg/dL — ABNORMAL HIGH (ref <200)
HDL: 47 mg/dL (ref >=40)
LDL Cholesterol (Calc): 134 mg/dL (calc) — ABNORMAL HIGH
Non-HDL Cholesterol (Calc): 169 mg/dL (calc) — ABNORMAL HIGH (ref <130)
Total CHOL/HDL Ratio: 4.6 (calc) (ref <5.0)
Triglycerides: 213 mg/dL — ABNORMAL HIGH (ref <150)

## 2019-01-25 LAB — COMPLETE METABOLIC PANEL WITH GFR
AG Ratio: 2 (calc) (ref 1.0–2.5)
ALT: 19 U/L (ref 9–46)
AST: 16 U/L (ref 10–35)
Albumin: 4.5 g/dL (ref 3.6–5.1)
Alkaline phosphatase (APISO): 51 U/L (ref 35–144)
BUN: 12 mg/dL (ref 7–25)
CO2: 26 mmol/L (ref 20–32)
Calcium: 9.5 mg/dL (ref 8.6–10.3)
Chloride: 103 mmol/L (ref 98–110)
Creat: 0.98 mg/dL (ref 0.70–1.33)
GFR, Est African American: 104 mL/min/{1.73_m2} (ref >=60)
GFR, Est Non African American: 90 mL/min/{1.73_m2} (ref >=60)
Globulin: 2.2 g/dL (calc) (ref 1.9–3.7)
Glucose, Bld: 96 mg/dL (ref 65–99)
Potassium: 4.8 mmol/L (ref 3.5–5.3)
Sodium: 139 mmol/L (ref 135–146)
Total Bilirubin: 0.6 mg/dL (ref 0.2–1.2)
Total Protein: 6.7 g/dL (ref 6.1–8.1)

## 2019-01-25 LAB — CBC
HCT: 47.3 % (ref 38.5–50.0)
Hemoglobin: 16.2 g/dL (ref 13.2–17.1)
MCH: 32.6 pg (ref 27.0–33.0)
MCHC: 34.2 g/dL (ref 32.0–36.0)
MCV: 95.2 fL (ref 80.0–100.0)
MPV: 11.2 fL (ref 7.5–12.5)
Platelets: 243 10*3/uL (ref 140–400)
RBC: 4.97 10*6/uL (ref 4.20–5.80)
RDW: 12.1 % (ref 11.0–15.0)
WBC: 5.1 10*3/uL (ref 3.8–10.8)

## 2019-01-25 LAB — URINALYSIS, ROUTINE W REFLEX MICROSCOPIC
Bilirubin Urine: NEGATIVE
Glucose, UA: NEGATIVE
Hgb urine dipstick: NEGATIVE
Ketones, ur: NEGATIVE
Leukocytes,Ua: NEGATIVE
Nitrite: NEGATIVE
Protein, ur: NEGATIVE
Specific Gravity, Urine: 1.02 (ref 1.001–1.03)
pH: 6 (ref 5.0–8.0)

## 2019-01-27 ENCOUNTER — Encounter: Payer: Self-pay | Admitting: Physician Assistant

## 2019-02-05 DIAGNOSIS — L578 Other skin changes due to chronic exposure to nonionizing radiation: Secondary | ICD-10-CM | POA: Diagnosis not present

## 2019-02-05 DIAGNOSIS — L570 Actinic keratosis: Secondary | ICD-10-CM | POA: Diagnosis not present

## 2019-02-05 DIAGNOSIS — L820 Inflamed seborrheic keratosis: Secondary | ICD-10-CM | POA: Diagnosis not present

## 2019-02-05 DIAGNOSIS — L821 Other seborrheic keratosis: Secondary | ICD-10-CM | POA: Diagnosis not present

## 2019-02-21 ENCOUNTER — Telehealth: Payer: Self-pay | Admitting: Physician Assistant

## 2019-02-21 ENCOUNTER — Encounter: Payer: Self-pay | Admitting: Physician Assistant

## 2019-02-21 ENCOUNTER — Ambulatory Visit (INDEPENDENT_AMBULATORY_CARE_PROVIDER_SITE_OTHER): Payer: BC Managed Care – PPO | Admitting: Physician Assistant

## 2019-02-21 VITALS — BP 121/78 | HR 64 | Wt 235.0 lb

## 2019-02-21 DIAGNOSIS — R030 Elevated blood-pressure reading, without diagnosis of hypertension: Secondary | ICD-10-CM | POA: Insufficient documentation

## 2019-02-21 DIAGNOSIS — E781 Pure hyperglyceridemia: Secondary | ICD-10-CM

## 2019-02-21 NOTE — Telephone Encounter (Signed)
Order re faxed -EH/RMA

## 2019-02-21 NOTE — Progress Notes (Signed)
Virtual Visit via Telephone Note  I connected with Michael MatasSean M Callahan on 02/21/19 at 11:10 AM EDT by telephone and verified that I am speaking with the correct person using two identifiers.   I discussed the limitations, risks, security and privacy concerns of performing an evaluation and management service by telephone and the availability of in person appointments. I also discussed with the patient that there may be a patient responsible charge related to this service. The patient expressed understanding and agreed to proceed.   History of Present Illness: HPI:                                                                Michael Callahan is a 48 y.o. male   HTN: has been monitoring BP at home. Checks BP's at home.  SBP range: 115-124, average 118-119 DBP range: 62-78, average 71 Max BP 124/78 Pulse 56-65 Has been walking 5-12 miles daily. Mountain biking 15-16 miles 1-2 times per week. He is eating plant-based. Has lost 5 lb Denies vision change, headache, chest pain with exertion, orthopnea, lightheadedness, syncope and edema.  Risk factors include: male sex, obesity    [facility] 01/24/2019 02/07/2017  Decreased Interest 0 0  Down, Depressed, Hopeless 0 0  PHQ - 2 Score 0 0    No flowsheet data found.    Past Medical History:  Diagnosis Date  . Bilateral shoulder pain 12/05/2016  . Borderline hyperlipidemia 02/08/2017  . Hyperglycemia 12/02/2014   A1c normal June 2016   . Prepatellar bursitis, right knee 09/04/2016  . Primary osteoarthritis of both hands 03/21/2016  . Seasonal allergies 03/24/2013   Past Surgical History:  Procedure Laterality Date  . HEAD & NECK SKIN LESION EXCISIONAL BIOPSY     Social History   Tobacco Use  . Smoking status: Never Smoker  . Smokeless tobacco: Never Used  Substance Use Topics  . Alcohol use: Yes    Comment: <3 drinks per month   family history includes Alcoholism in his father; Brain cancer in an other family member; Dementia  in his mother; Prostate cancer in his maternal grandfather; Stroke in his mother.    ROS: negative except as noted in the HPI  Medications: No current outpatient medications on file.   No current facility-administered medications for this visit.    Allergies  Allergen Reactions  . Other Other (See Comments)    Band-aids  . Doxycycline Nausea And Vomiting  . Penicillins     REACTION: rash       Objective:  BP 121/78   Pulse 64   Wt 235 lb (106.6 kg)   BMI 30.17 kg/m   Wt Readings from Last 3 Encounters:  02/21/19 235 lb (106.6 kg)  01/24/19 240 lb (108.9 kg)  06/25/18 251 lb (113.9 kg)   Temp Readings from Last 3 Encounters:  01/24/19 97.6 F (36.4 C) (Oral)  08/17/14 97.8 F (36.6 C) (Oral)  07/20/14 97.6 F (36.4 C) (Oral)   BP Readings from Last 3 Encounters:  02/21/19 121/78  01/24/19 126/86  06/25/18 128/83   Pulse Readings from Last 3 Encounters:  02/21/19 64  01/24/19 64  06/25/18 69    Gen:  alert, not ill-appearing, no distress, appropriate for age HEENT: head normocephalic without  obvious abnormality, conjunctiva and cornea clear, trachea midline Pulm: Normal work of breathing, normal phonation, clear to auscultation bilaterally, no wheezes, rales or rhonchi CV: Normal rate, regular rhythm, s1 and s2 distinct, no murmurs, clicks or rubs  Neuro: alert and oriented x 3, no tremor MSK: extremities atraumatic, normal gait and station Skin: intact, no rashes on exposed skin, no jaundice, no cyanosis Psych: well-groomed, cooperative, good eye contact, euthymic mood, affect mood-congruent, speech is articulate, and thought processes clear and goal-directed  Recent Results (from the past 2160 hour(s))  CBC     Status: None   Collection Time: 01/24/19 10:35 AM  Result Value Ref Range   WBC 5.1 3.8 - 10.8 Thousand/uL   RBC 4.97 4.20 - 5.80 Million/uL   Hemoglobin 16.2 13.2 - 17.1 g/dL   HCT 74.8 27.0 - 78.6 %   MCV 95.2 80.0 - 100.0 fL   MCH 32.6  27.0 - 33.0 pg   MCHC 34.2 32.0 - 36.0 g/dL   RDW 75.4 49.2 - 01.0 %   Platelets 243 140 - 400 Thousand/uL   MPV 11.2 7.5 - 12.5 fL  COMPLETE METABOLIC PANEL WITH GFR     Status: None   Collection Time: 01/24/19 10:35 AM  Result Value Ref Range   Glucose, Bld 96 65 - 99 mg/dL    Comment: .            Fasting reference interval .    BUN 12 7 - 25 mg/dL   Creat 0.71 2.19 - 7.58 mg/dL    Comment: For patients >68 years of age, the reference limit for Creatinine is approximately 13% higher for people identified as African-American. .    GFR, Est Non African American 90 > OR = 60 mL/min/1.36m2   GFR, Est African American 104 > OR = 60 mL/min/1.53m2   BUN/Creatinine Ratio NOT APPLICABLE 6 - 22 (calc)   Sodium 139 135 - 146 mmol/L   Potassium 4.8 3.5 - 5.3 mmol/L   Chloride 103 98 - 110 mmol/L   CO2 26 20 - 32 mmol/L   Calcium 9.5 8.6 - 10.3 mg/dL   Total Protein 6.7 6.1 - 8.1 g/dL   Albumin 4.5 3.6 - 5.1 g/dL   Globulin 2.2 1.9 - 3.7 g/dL (calc)   AG Ratio 2.0 1.0 - 2.5 (calc)   Total Bilirubin 0.6 0.2 - 1.2 mg/dL   Alkaline phosphatase (APISO) 51 35 - 144 U/L   AST 16 10 - 35 U/L   ALT 19 9 - 46 U/L  Lipid Panel w/reflex Direct LDL     Status: Abnormal   Collection Time: 01/24/19 10:35 AM  Result Value Ref Range   Cholesterol 216 (H) <200 mg/dL   HDL 47 > OR = 40 mg/dL   Triglycerides 832 (H) <150 mg/dL    Comment: . If a non-fasting specimen was collected, consider repeat triglyceride testing on a fasting specimen if clinically indicated.  Perry Mount et al. J. of Clin. Lipidol. 2015;9:129-169. Marland Kitchen    LDL Cholesterol (Calc) 134 (H) mg/dL (calc)    Comment: Reference range: <100 . Desirable range <100 mg/dL for primary prevention;   <70 mg/dL for patients with CHD or diabetic patients  with > or = 2 CHD risk factors. Marland Kitchen LDL-C is now calculated using the Martin-Hopkins  calculation, which is a validated novel method providing  better accuracy than the Friedewald equation  in the  estimation of LDL-C.  Horald Pollen et al. Lenox Ahr. 5498;264(15): 2061-2068  (http://education.[facility].com/faq/FAQ164)  Total CHOL/HDL Ratio 4.6 <5.0 (calc)   Non-HDL Cholesterol (Calc) 169 (H) <130 mg/dL (calc)    Comment: For patients with diabetes plus 1 major ASCVD risk  factor, treating to a non-HDL-C goal of <100 mg/dL  (LDL-C of <70 mg/dL) is considered a therapeutic  option.   Urinalysis, Routine w reflex microscopic     Status: None   Collection Time: 01/24/19 10:35 AM  Result Value Ref Range   Color, Urine YELLOW YELLOW   APPearance CLEAR CLEAR   Specific Gravity, Urine 1.020 1.001 - 1.03   pH 6.0 5.0 - 8.0   Glucose, UA NEGATIVE NEGATIVE   Bilirubin Urine NEGATIVE NEGATIVE   Ketones, ur NEGATIVE NEGATIVE   Hgb urine dipstick NEGATIVE NEGATIVE   Protein, ur NEGATIVE NEGATIVE   Nitrite NEGATIVE NEGATIVE   Leukocytes,Ua NEGATIVE NEGATIVE   The 10-year ASCVD risk score Mikey Bussing DC Jr., et al., 2013) is: 3.7%   Values used to calculate the score:     Age: 62 years     Sex: Male     Is Non-Hispanic African American: No     Diabetic: No     Tobacco smoker: No     Systolic Blood Pressure: 505 mmHg     Is BP treated: No     HDL Cholesterol: 47 mg/dL     Total Cholesterol: 216 mg/dL     Assessment and Plan: 48 y.o. male with   .Michael Callahan was seen today for follow-up.  Diagnoses and all orders for this visit:  White coat syndrome without diagnosis of hypertension  Hypertriglyceridemia   Home BP's in range He has borderline high cholesterol w/mild hypertriglyceridemia. LDL goal <100. He continues to work on aggressive lifestyle changes and weight loss  Follow-up in 11 months for CPE w/fasting labs   Follow Up Instructions:    I discussed the assessment and treatment plan with the patient. The patient was provided an opportunity to ask questions and all were answered. The patient agreed with the plan and demonstrated an understanding of the  instructions.   The patient was advised to call back or seek an in-person evaluation if the symptoms worsen or if the condition fails to improve as anticipated.  I provided 5-10 minutes of non-face-to-face time during this encounter.   Trixie Dredge, [place]

## 2019-02-21 NOTE — Telephone Encounter (Signed)
Patient state she has not been contacted by [employer] or received a kit Please [employer]

## 2019-02-21 NOTE — Telephone Encounter (Signed)
Called and spoke with customer service at [facility], they have not received an order for this patient.  If order form was sent before, can we resend to them?

## 2019-03-18 DIAGNOSIS — Z1212 Encounter for screening for malignant neoplasm of rectum: Secondary | ICD-10-CM | POA: Diagnosis not present

## 2019-03-18 DIAGNOSIS — Z1211 Encounter for screening for malignant neoplasm of colon: Secondary | ICD-10-CM | POA: Diagnosis not present

## 2019-03-18 LAB — COLOGUARD: Cologuard: NEGATIVE

## 2019-03-24 ENCOUNTER — Telehealth: Payer: Self-pay | Admitting: Family Medicine

## 2019-03-24 NOTE — Telephone Encounter (Signed)
Left VM for Pt to return clinic call regarding results, callback information provided. 

## 2019-03-24 NOTE — Telephone Encounter (Signed)
Call patient: Cologuard results are negative.  Repeat colon cancer screening in 3 years.

## 2019-04-08 NOTE — Telephone Encounter (Signed)
Please mail letter.

## 2019-04-09 NOTE — Telephone Encounter (Signed)
Left second VM, will mail letter.

## 2019-04-09 NOTE — Telephone Encounter (Signed)
Patient called office for results, results given.

## 2019-04-30 ENCOUNTER — Encounter: Payer: Self-pay | Admitting: Physician Assistant

## 2019-04-30 LAB — COLOGUARD

## 2019-05-14 DIAGNOSIS — D485 Neoplasm of uncertain behavior of skin: Secondary | ICD-10-CM | POA: Diagnosis not present

## 2019-05-14 DIAGNOSIS — L821 Other seborrheic keratosis: Secondary | ICD-10-CM | POA: Diagnosis not present

## 2019-05-14 DIAGNOSIS — L820 Inflamed seborrheic keratosis: Secondary | ICD-10-CM | POA: Diagnosis not present

## 2019-05-14 DIAGNOSIS — L570 Actinic keratosis: Secondary | ICD-10-CM | POA: Diagnosis not present

## 2019-12-24 DIAGNOSIS — L821 Other seborrheic keratosis: Secondary | ICD-10-CM | POA: Diagnosis not present

## 2019-12-24 DIAGNOSIS — L570 Actinic keratosis: Secondary | ICD-10-CM | POA: Diagnosis not present

## 2019-12-24 DIAGNOSIS — L820 Inflamed seborrheic keratosis: Secondary | ICD-10-CM | POA: Diagnosis not present

## 2019-12-24 DIAGNOSIS — L578 Other skin changes due to chronic exposure to nonionizing radiation: Secondary | ICD-10-CM | POA: Diagnosis not present

## 2019-12-24 DIAGNOSIS — C4441 Basal cell carcinoma of skin of scalp and neck: Secondary | ICD-10-CM | POA: Diagnosis not present

## 2020-01-14 DIAGNOSIS — C4441 Basal cell carcinoma of skin of scalp and neck: Secondary | ICD-10-CM | POA: Diagnosis not present

## 2020-06-29 DIAGNOSIS — Z20822 Contact with and (suspected) exposure to covid-19: Secondary | ICD-10-CM | POA: Diagnosis not present

## 2020-09-17 ENCOUNTER — Other Ambulatory Visit: Payer: Self-pay

## 2020-09-17 ENCOUNTER — Ambulatory Visit (INDEPENDENT_AMBULATORY_CARE_PROVIDER_SITE_OTHER): Payer: BC Managed Care – PPO | Admitting: Sports Medicine

## 2020-09-17 ENCOUNTER — Ambulatory Visit (INDEPENDENT_AMBULATORY_CARE_PROVIDER_SITE_OTHER): Payer: BC Managed Care – PPO

## 2020-09-17 ENCOUNTER — Encounter: Payer: Self-pay | Admitting: Medical-Surgical

## 2020-09-17 DIAGNOSIS — M25474 Effusion, right foot: Secondary | ICD-10-CM

## 2020-09-17 MED ORDER — INDOMETHACIN 50 MG PO CAPS: 50.0000 mg | ORAL_CAPSULE | Freq: Two times a day (BID) | ORAL | 0 refills | Status: DC

## 2020-09-17 NOTE — Progress Notes (Addendum)
    Procedures performed today:    Procedure: Real-time Ultrasound Guided aspiration/injection of right first MTP Device: Samsung HS60  Verbal informed consent obtained.  Time-out conducted.  Noted no overlying erythema, induration, or other signs of local infection.  Skin prepped in a sterile fashion.  Local anesthesia: Topical Ethyl chloride.  With sterile technique and under real time ultrasound guidance:  Using a 22-gauge needle aspirated a scant amount of cloudy, straw-colored fluid, syringe switched and 0.5 cc kenalog 40, 0.5 cc lidocaine injected easily.   Completed without difficulty  Advised to call if fevers/chills, erythema, induration, drainage, or persistent bleeding.  Images permanently stored and available for review in PACS.  Impression: Technically successful ultrasound guided injection.  Independent interpretation of notes and tests performed by another provider:   None.  Brief History, Exam, Impression, and Recommendations:    Swelling of first metatarsophalangeal (MTP) joint of right foot This is a very pleasant 50 year old male, on Monday morning he developed rapid onset swelling of his right great toe with redness, heat. Had hamburger and a beer the day before. Never had a gout flare prior. On exam he has mild effusion, exquisite tenderness and pain particular with dorsiflexion of his first MTP. Today performed an aspiration and injection. Adding indomethacin. We would see him back in a month and likely get uric acid levels, if all of the above confirms gout then uric acid level goal is less than 5, we can use allopurinol to bring it down.  No crystals found suggesting that this is osteoarthritis rather than gout.  We will keep an eye on things and still probably check his uric acid levels at the follow-up visit.    ___________________________________________ Michael Callahan. Benjamin Stain, M.D., ABFM., CAQSM. Primary Care and Sports Medicine [facility]  [facility]  Adjunct Instructor of Family Medicine  [employer]

## 2020-09-17 NOTE — Progress Notes (Deleted)
  Subjective:    CC:   HPI:   I reviewed the past medical history, family history, social history, surgical history, and allergies today and no changes were needed.  Please see the problem list section below in epic for further details.  Past Medical History: Past Medical History:  Diagnosis Date  . Bilateral shoulder pain 12/05/2016  . Borderline hyperlipidemia 02/08/2017  . Hyperglycemia 12/02/2014   A1c normal June 2016   . Prepatellar bursitis, right knee 09/04/2016  . Primary osteoarthritis of both hands 03/21/2016  . Seasonal allergies 03/24/2013   Past Surgical History: Past Surgical History:  Procedure Laterality Date  . HEAD & NECK SKIN LESION EXCISIONAL BIOPSY     Social History: Social History   Socioeconomic History  . Marital status: Legally Separated    Spouse name: Not on file  . Number of children: Not on file  . Years of education: Not on file  . Highest education level: Not on file  Occupational History  . Not on file  Tobacco Use  . Smoking status: Never Smoker  . Smokeless tobacco: Never Used  Vaping Use  . Vaping Use: Never used  Substance and Sexual Activity  . Alcohol use: Yes    Comment: <3 drinks per month  . Drug use: No  . Sexual activity: Yes    Partners: Female    Birth control/protection: None  Other Topics Concern  . Not on file  Social History Narrative  . Not on file   Social Determinants of Health   Financial Resource Strain: Not on file  Food Insecurity: Not on file  Transportation Needs: Not on file  Physical Activity: Not on file  Stress: Not on file  Social Connections: Not on file   Family History: Family History  Problem Relation Age of Onset  . Alcoholism Father        grandfather  . Brain cancer Other        uncle  . Stroke Mother   . Dementia Mother   . Prostate cancer Maternal Grandfather    Allergies: Allergies  Allergen Reactions  . Other Other (See Comments)    Band-aids  . Doxycycline Nausea And  Vomiting  . Penicillins     REACTION: rash   Medications: See med rec.  Review of Systems: See HPI for pertinent positives and negatives.   Objective:    General: Well Developed, well nourished, and in no acute distress.  Neuro: Alert and oriented x3, extra-ocular muscles intact, sensation grossly intact.  HEENT: Normocephalic, atraumatic, pupils equal round reactive to light, neck supple, no masses, no lymphadenopathy, thyroid nonpalpable.  Skin: Warm and dry, no rashes. Cardiac: Regular rate and rhythm, no murmurs rubs or gallops, no lower extremity edema.  Respiratory: Clear to auscultation bilaterally. Not using accessory muscles, speaking in full sentences.   Impression and Recommendations:      No follow-ups on file.  ___________________________________________ Thayer Ohm, DNP, APRN, FNP-BC Primary Care and Sports Medicine [facility]

## 2020-09-17 NOTE — Assessment & Plan Note (Addendum)
This is a very pleasant 50 year old male, on Monday morning he developed rapid onset swelling of his right great toe with redness, heat. Had hamburger and a beer the day before. Never had a gout flare prior. On exam he has mild effusion, exquisite tenderness and pain particular with dorsiflexion of his first MTP. Today performed an aspiration and injection. Adding indomethacin. We would see him back in a month and likely get uric acid levels, if all of the above confirms gout then uric acid level goal is less than 5, we can use allopurinol to bring it down.  No crystals found suggesting that this is osteoarthritis rather than gout.  We will keep an eye on things and still probably check his uric acid levels at the follow-up visit.

## 2020-09-17 NOTE — Patient Instructions (Signed)
Low-Purine Eating Plan A low-purine eating plan involves making food choices to limit your intake of purine. Purine is a kind of uric acid. Too much uric acid in your blood can cause certain conditions, such as gout and kidney stones. Eating a low-purine diet can help control these conditions. What are tips for following this plan? Reading food labels  Avoid foods with saturated or Trans fat.  Check the ingredient list of grains-based foods, such as bread and cereal, to make sure that they contain whole grains.  Check the ingredient list of sauces or soups to make sure they do not contain meat or fish.  When choosing soft drinks, check the ingredient list to make sure they do not contain high-fructose corn syrup. Shopping  Buy plenty of fresh fruits and vegetables.  Avoid buying canned or fresh fish.  Buy dairy products labeled as low-fat or nonfat.  Avoid buying premade or processed foods. These foods are often high in fat, salt (sodium), and added sugar.   Cooking  Use olive oil instead of butter when cooking. Oils like olive oil, canola oil, and sunflower oil contain healthy fats. Meal planning  Learn which foods do or do not affect you. If you find out that a food tends to cause your gout symptoms to flare up, avoid eating that food. You can enjoy foods that do not cause problems. If you have any questions about a food item, talk with your dietitian or health care provider.  Limit foods high in fat, especially saturated fat. Fat makes it harder for your body to get rid of uric acid.  Choose foods that are lower in fat and are lean sources of protein. General guidelines  Limit alcohol intake to no more than 1 drink a day for nonpregnant women and 2 drinks a day for men. One drink equals 12 oz of beer, 5 oz of wine, or 1 oz of hard liquor. Alcohol can affect the way your body gets rid of uric acid.  Drink plenty of water to keep your urine clear or pale yellow. Fluids can help  remove uric acid from your body.  If directed by your health care provider, take a vitamin C supplement.  Work with your health care provider and dietitian to develop a plan to achieve or maintain a healthy weight. Losing weight can help reduce uric acid in your blood. What foods are recommended? The items listed may not be a complete list. Talk with your dietitian about what dietary choices are best for you. Foods low in purines Foods low in purines do not need to be limited. These include:  All fruits.  All low-purine vegetables, pickles, and olives.  Breads, pasta, rice, cornbread, and popcorn. Cake and other baked goods.  All dairy foods.  Eggs, nuts, and nut butters.  Spices and condiments, such as salt, herbs, and vinegar.  Plant oils, butter, and margarine.  Water, sugar-free soft drinks, tea, coffee, and cocoa.  Vegetable-based soups, broths, sauces, and gravies. Foods moderate in purines Foods moderate in purines should be limited to the amounts listed.   cup of asparagus, cauliflower, spinach, mushrooms, or green peas, each day.  2/3 cup uncooked oatmeal, each day.   cup dry wheat bran or wheat germ, each day.  2-3 ounces of meat or poultry, each day.  4-6 ounces of shellfish, such as crab, lobster, oysters, or shrimp, each day.  1 cup cooked beans, peas, or lentils, each day.  Soup, broths, or bouillon made from meat   or fish. Limit these foods as much as possible. What foods are not recommended? The items listed may not be a complete list. Talk with your dietitian about what dietary choices are best for you. Limit your intake of foods high in purines, including:  Beer and other alcohol.  Meat-based gravy or sauce.  Canned or fresh fish, such as: ? Anchovies, sardines, herring, and tuna. ? Mussels and scallops. ? Codfish, trout, and haddock.  Bacon.  Organ meats, such as: ? Liver or kidney. ? Tripe. ? Sweetbreads (thymus gland or  pancreas).  Wild game or goose.  Yeast or yeast extract supplements.  Drinks sweetened with high-fructose corn syrup. Summary  Eating a low-purine diet can help control conditions caused by too much uric acid in the body, such as gout or kidney stones.  Choose low-purine foods, limit alcohol, and limit foods high in fat.  You will learn over time which foods do or do not affect you. If you find out that a food tends to cause your gout symptoms to flare up, avoid eating that food. This information is not intended to replace advice given to you by your health care provider. Make sure you discuss any questions you have with your health care provider. Document Revised: 09/25/2019 Document Reviewed: 09/25/2019 Elsevier Patient Education  2021 [employer] Inc.  Gout  Gout is a condition that causes painful swelling of the joints. Gout is a type of inflammation of the joints (arthritis). This condition is caused by having too much uric acid in the body. Uric acid is a chemical that forms when the body breaks down substances called purines. Purines are important for building body proteins. When the body has too much uric acid, sharp crystals can form and build up inside the joints. This causes pain and swelling. Gout attacks can happen quickly and may be very painful (acute gout). Over time, the attacks can affect more joints and become more frequent (chronic gout). Gout can also cause uric acid to build up under the skin and inside the kidneys. What are the causes? This condition is caused by too much uric acid in your blood. This can happen because:  Your kidneys do not remove enough uric acid from your blood. This is the most common cause.  Your body makes too much uric acid. This can happen with some cancers and cancer treatments. It can also occur if your body is breaking down too many red blood cells (hemolytic anemia).  You eat too many foods that are high in purines. These foods include  organ meats and some seafood. Alcohol, especially beer, is also high in purines. A gout attack may be triggered by trauma or stress. What increases the risk? You are more likely to develop this condition if you:  Have a family history of gout.  Are male and middle-aged.  Are male and have gone through menopause.  Are obese.  Frequently drink alcohol, especially beer.  Are dehydrated.  Lose weight too quickly.  Have an organ transplant.  Have lead poisoning.  Take certain medicines, including aspirin, cyclosporine, diuretics, levodopa, and niacin.  Have kidney disease.  Have a skin condition called psoriasis. What are the signs or symptoms? An attack of acute gout happens quickly. It usually occurs in just one joint. The most common place is the big toe. Attacks often start at night. Other joints that may be affected include joints of the feet, ankle, knee, fingers, wrist, or elbow. Symptoms of this condition may include:    Severe pain.  Warmth.  Swelling.  Stiffness.  Tenderness. The affected joint may be very painful to touch.  Shiny, red, or purple skin.  Chills and fever. Chronic gout may cause symptoms more frequently. More joints may be involved. You may also have white or yellow lumps (tophi) on your hands or feet or in other areas near your joints.   How is this diagnosed? This condition is diagnosed based on your symptoms, medical history, and physical exam. You may have tests, such as:  Blood tests to measure uric acid levels.  Removal of joint fluid with a thin needle (aspiration) to look for uric acid crystals.  X-rays to look for joint damage. How is this treated? Treatment for this condition has two phases: treating an acute attack and preventing future attacks. Acute gout treatment may include medicines to reduce pain and swelling, including:  NSAIDs.  Steroids. These are strong anti-inflammatory medicines that can be taken by mouth (orally) or  injected into a joint.  Colchicine. This medicine relieves pain and swelling when it is taken soon after an attack. It can be given by mouth or through an IV. Preventive treatment may include:  Daily use of smaller doses of NSAIDs or colchicine.  Use of a medicine that reduces uric acid levels in your blood.  Changes to your diet. You may need to see a dietitian about what to eat and drink to prevent gout. Follow these instructions at home: During a gout attack  If directed, put ice on the affected area: ? Put ice in a plastic bag. ? Place a towel between your skin and the bag. ? Leave the ice on for 20 minutes, 2-3 times a day.  Raise (elevate) the affected joint above the level of your heart as often as possible.  Rest the joint as much as possible. If the affected joint is in your leg, you may be given crutches to use.  Follow instructions from your health care provider about eating or drinking restrictions.   Avoiding future gout attacks  Follow a low-purine diet as told by your dietitian or health care provider. Avoid foods and drinks that are high in purines, including liver, kidney, anchovies, asparagus, herring, mushrooms, mussels, and beer.  Maintain a healthy weight or lose weight if you are overweight. If you want to lose weight, talk with your health care provider. It is important that you do not lose weight too quickly.  Start or maintain an exercise program as told by your health care provider. Eating and drinking  Drink enough fluids to keep your urine pale yellow.  If you drink alcohol: ? Limit how much you use to:  0-1 drink a day for women.  0-2 drinks a day for men. ? Be aware of how much alcohol is in your drink. In the [place] one drink equals one 12 oz bottle of beer (355 mL) one 5 oz glass of wine (148 mL), or one 1 oz glass of hard liquor (44 mL). General instructions  Take over-the-counter and prescription medicines only as told by your health care  provider.  Do not drive or use heavy machinery while taking prescription pain medicine.  Return to your normal activities as told by your health care provider. Ask your health care provider what activities are safe for you.  Keep all follow-up visits as told by your health care provider. This is important. Contact a health care provider if you have:  Another gout attack.  Continuing symptoms of a   gout attack after 10 days of treatment.  Side effects from your medicines.  Chills or a fever.  Burning pain when you urinate.  Pain in your lower back or belly. Get help right away if you:  Have severe or uncontrolled pain.  Cannot urinate. Summary  Gout is painful swelling of the joints caused by inflammation.  The most common site of pain is the big toe, but it can affect other joints in the body.  Medicines and dietary changes can help to prevent and treat gout attacks. This information is not intended to replace advice given to you by your health care provider. Make sure you discuss any questions you have with your health care provider. Document Revised: 01/02/2018 Document Reviewed: 01/02/2018 Elsevier Patient Education  2021 Elsevier Inc.  

## 2020-09-18 LAB — SYNOVIAL FLUID, CRYSTAL

## 2020-10-04 ENCOUNTER — Ambulatory Visit (INDEPENDENT_AMBULATORY_CARE_PROVIDER_SITE_OTHER): Payer: BC Managed Care – PPO | Admitting: Family Medicine

## 2020-10-04 ENCOUNTER — Encounter: Payer: Self-pay | Admitting: Family Medicine

## 2020-10-04 ENCOUNTER — Other Ambulatory Visit: Payer: Self-pay

## 2020-10-04 VITALS — BP 130/87 | HR 68 | Temp 97.9°F | Ht 74.11 in | Wt 249.9 lb

## 2020-10-04 DIAGNOSIS — Z23 Encounter for immunization: Secondary | ICD-10-CM | POA: Diagnosis not present

## 2020-10-04 DIAGNOSIS — Z0000 Encounter for general adult medical examination without abnormal findings: Secondary | ICD-10-CM

## 2020-10-04 DIAGNOSIS — E781 Pure hyperglyceridemia: Secondary | ICD-10-CM | POA: Diagnosis not present

## 2020-10-04 DIAGNOSIS — Z8042 Family history of malignant neoplasm of prostate: Secondary | ICD-10-CM | POA: Diagnosis not present

## 2020-10-04 DIAGNOSIS — Z125 Encounter for screening for malignant neoplasm of prostate: Secondary | ICD-10-CM

## 2020-10-04 DIAGNOSIS — Z13220 Encounter for screening for lipoid disorders: Secondary | ICD-10-CM | POA: Diagnosis not present

## 2020-10-04 DIAGNOSIS — R5383 Other fatigue: Secondary | ICD-10-CM

## 2020-10-04 NOTE — Progress Notes (Signed)
Michael Callahan - 50 y.o. male MRN [id number]  Date of birth: 1970-11-23  Subjective Chief Complaint  Patient presents with  . Establish Care    HPI Michael Callahan is a 50 y.o. male here today for annual exam.  He has been in pretty good health.  He takes no long term medications.     He has concern about small nodule on the back of the L arm.  He would like to have testosterone levels checked.  He has had decreased libido and fatigue.    He also has had some issues with excess urination.  He does consume a lot of fluids but feels like urination is excessive.  History or prostate cancer in his grandfather.    He is a non-smoker and rarely consumes EtOH.    He exercises regularly and feels like diet is pretty healthy.   Review of Systems  Constitutional: Negative for chills, fever, malaise/fatigue and weight loss.  HENT: Negative for congestion, ear pain and sore throat.   Eyes: Negative for blurred vision, double vision and pain.  Respiratory: Negative for cough and shortness of breath.   Cardiovascular: Negative for chest pain and palpitations.  Gastrointestinal: Negative for abdominal pain, blood in stool, constipation, heartburn and nausea.  Genitourinary: Negative for dysuria and urgency.  Musculoskeletal: Negative for joint pain and myalgias.  Neurological: Negative for dizziness and headaches.  Endo/Heme/Allergies: Does not bruise/bleed easily.  Psychiatric/Behavioral: Negative for depression. The patient is not nervous/anxious and does not have insomnia.     Allergies  Allergen Reactions  . Other Other (See Comments)    Band-aids  . Doxycycline Nausea And Vomiting  . Penicillins     REACTION: rash    Past Medical History:  Diagnosis Date  . Bilateral shoulder pain 12/05/2016  . Borderline hyperlipidemia 02/08/2017  . Hyperglycemia 12/02/2014   A1c normal June 2016   . Prepatellar bursitis, right knee 09/04/2016  . Primary osteoarthritis of both hands 03/21/2016  .  Seasonal allergies 03/24/2013    Past Surgical History:  Procedure Laterality Date  . HEAD & NECK SKIN LESION EXCISIONAL BIOPSY      Social History   Socioeconomic History  . Marital status: Legally Separated    Spouse name: Not on file  . Number of children: Not on file  . Years of education: Not on file  . Highest education level: Not on file  Occupational History  . Not on file  Tobacco Use  . Smoking status: Never Smoker  . Smokeless tobacco: Never Used  Vaping Use  . Vaping Use: Never used  Substance and Sexual Activity  . Alcohol use: Yes    Comment: <3 drinks per month  . Drug use: No  . Sexual activity: Yes    Partners: Female    Birth control/protection: None  Other Topics Concern  . Not on file  Social History Narrative  . Not on file   Social Determinants of Health   Financial Resource Strain: Not on file  Food Insecurity: Not on file  Transportation Needs: Not on file  Physical Activity: Not on file  Stress: Not on file  Social Connections: Not on file    Family History  Problem Relation Age of Onset  . Alcoholism Father        grandfather  . Brain cancer Other        uncle  . Stroke Mother   . Dementia Mother   . Prostate cancer Maternal Grandfather  Health Maintenance  Topic Date Due  . Hepatitis C Screening  Never done  . HIV Screening  Never done  . COVID-19 Vaccine (1) 10/20/2020 (Originally 01/08/1976)  . INFLUENZA VACCINE  01/24/2021  . Fecal DNA (Cologuard)  03/17/2022  . TETANUS/TDAP  10/25/2024  . HPV VACCINES  Aged Out     ----------------------------------------------------------------------------------------------------------------------------------------------------------------------------------------------------------------- Physical Exam BP 130/87 (BP Location: Left Arm, Patient Position: Sitting, Cuff Size: Large)   Pulse 68   Temp 97.9 F (36.6 C)   Ht 6' 2.11" (1.882 m)   Wt 249 lb 14.4 oz (113.4 kg)   SpO2  96%   BMI 31.99 kg/m   Physical Exam Constitutional:      General: He is not in acute distress. HENT:     Head: Normocephalic and atraumatic.     Right Ear: Tympanic membrane and external ear normal.     Left Ear: Tympanic membrane and external ear normal.  Eyes:     General: No scleral icterus. Neck:     Thyroid: No thyromegaly.  Cardiovascular:     Rate and Rhythm: Normal rate and regular rhythm.     Heart sounds: Normal heart sounds.  Pulmonary:     Effort: Pulmonary effort is normal.     Breath sounds: Normal breath sounds.  Abdominal:     General: Bowel sounds are normal. There is no distension.     Palpations: Abdomen is soft.     Tenderness: There is no abdominal tenderness. There is no guarding.  Musculoskeletal:     Cervical back: Normal range of motion.  Lymphadenopathy:     Cervical: No cervical adenopathy.  Skin:    General: Skin is warm and dry.     Findings: No rash.     Comments: Small cyst posterior L arm.  Non tender, mobile.   Neurological:     General: No focal deficit present.     Mental Status: He is alert and oriented to person, place, and time.     Cranial Nerves: No cranial nerve deficit.     Motor: No abnormal muscle tone.  Psychiatric:        Mood and Affect: Mood normal.        Behavior: Behavior normal.     ------------------------------------------------------------------------------------------------------------------------------------------------------------------------------------------------------------------- Assessment and Plan  Well adult exam Well adult Orders Placed This Encounter  Procedures  . Varicella-zoster vaccine IM (Shingrix)  . COMPLETE METABOLIC PANEL WITH GFR  . CBC with Differential  . Lipid Panel w/reflex Direct LDL  . TSH  . PSA  . Testosterone  Screening: PSA, lipid Immunizations:  Shingrix #1 given today Anticipatory guidance/Risk factor reduction:  Recommendations per AVS.     No orders of the  defined types were placed in this encounter.   Return in about 4 months (around 02/03/2021) for Nurse visit for Shingrix #2.    This visit occurred during the SARS-CoV-2 public health emergency.  Safety protocols were in place, including screening questions prior to the visit, additional usage of staff PPE, and extensive cleaning of exam room while observing appropriate contact time as indicated for disinfecting solutions.

## 2020-10-04 NOTE — Patient Instructions (Signed)

## 2020-10-04 NOTE — Assessment & Plan Note (Signed)
Well adult Orders Placed This Encounter  Procedures  . Varicella-zoster vaccine IM (Shingrix)  . COMPLETE METABOLIC PANEL WITH GFR  . CBC with Differential  . Lipid Panel w/reflex Direct LDL  . TSH  . PSA  . Testosterone  Screening: PSA, lipid Immunizations:  Shingrix #1 given today Anticipatory guidance/Risk factor reduction:  Recommendations per AVS.

## 2020-10-07 DIAGNOSIS — Z125 Encounter for screening for malignant neoplasm of prostate: Secondary | ICD-10-CM | POA: Diagnosis not present

## 2020-10-07 DIAGNOSIS — E781 Pure hyperglyceridemia: Secondary | ICD-10-CM | POA: Diagnosis not present

## 2020-10-07 DIAGNOSIS — Z13220 Encounter for screening for lipoid disorders: Secondary | ICD-10-CM | POA: Diagnosis not present

## 2020-10-07 DIAGNOSIS — R5383 Other fatigue: Secondary | ICD-10-CM | POA: Diagnosis not present

## 2020-10-08 LAB — CBC WITH DIFFERENTIAL/PLATELET
Absolute Monocytes: 456 cells/uL (ref 200–950)
Basophils Absolute: 48 cells/uL (ref 0–200)
Basophils Relative: 1 %
Eosinophils Absolute: 120 cells/uL (ref 15–500)
Eosinophils Relative: 2.5 %
HCT: 46 % (ref 38.5–50.0)
Hemoglobin: 15.6 g/dL (ref 13.2–17.1)
Lymphs Abs: 1392 cells/uL (ref 850–3900)
MCH: 32.4 pg (ref 27.0–33.0)
MCHC: 33.9 g/dL (ref 32.0–36.0)
MCV: 95.4 fL (ref 80.0–100.0)
MPV: 11.2 fL (ref 7.5–12.5)
Monocytes Relative: 9.5 %
Neutro Abs: 2784 cells/uL (ref 1500–7800)
Neutrophils Relative %: 58 %
Platelets: 232 10*3/uL (ref 140–400)
RBC: 4.82 10*6/uL (ref 4.20–5.80)
RDW: 11.5 % (ref 11.0–15.0)
Total Lymphocyte: 29 %
WBC: 4.8 10*3/uL (ref 3.8–10.8)

## 2020-10-08 LAB — LIPID PANEL W/REFLEX DIRECT LDL
Cholesterol: 188 mg/dL (ref <200)
HDL: 41 mg/dL (ref >=40)
LDL Cholesterol (Calc): 118 mg/dL (calc) — ABNORMAL HIGH
Non-HDL Cholesterol (Calc): 147 mg/dL (calc) — ABNORMAL HIGH (ref <130)
Total CHOL/HDL Ratio: 4.6 (calc) (ref <5.0)
Triglycerides: 170 mg/dL — ABNORMAL HIGH (ref <150)

## 2020-10-08 LAB — COMPLETE METABOLIC PANEL WITH GFR
AG Ratio: 2 (calc) (ref 1.0–2.5)
ALT: 29 U/L (ref 9–46)
AST: 45 U/L — ABNORMAL HIGH (ref 10–35)
Albumin: 4.5 g/dL (ref 3.6–5.1)
Alkaline phosphatase (APISO): 50 U/L (ref 35–144)
BUN: 13 mg/dL (ref 7–25)
CO2: 27 mmol/L (ref 20–32)
Calcium: 9.1 mg/dL (ref 8.6–10.3)
Chloride: 105 mmol/L (ref 98–110)
Creat: 1.02 mg/dL (ref 0.70–1.33)
GFR, Est African American: 98 mL/min/{1.73_m2} (ref >=60)
GFR, Est Non African American: 85 mL/min/{1.73_m2} (ref >=60)
Globulin: 2.2 g/dL (calc) (ref 1.9–3.7)
Glucose, Bld: 95 mg/dL (ref 65–99)
Potassium: 4.6 mmol/L (ref 3.5–5.3)
Sodium: 139 mmol/L (ref 135–146)
Total Bilirubin: 0.5 mg/dL (ref 0.2–1.2)
Total Protein: 6.7 g/dL (ref 6.1–8.1)

## 2020-10-08 LAB — TSH: TSH: 1.47 mIU/L (ref 0.40–4.50)

## 2020-10-08 LAB — TESTOSTERONE: Testosterone: 493 ng/dL (ref 250–827)

## 2020-10-08 LAB — PSA: PSA: 0.91 ng/mL (ref <=4.0)

## 2020-10-15 ENCOUNTER — Ambulatory Visit: Payer: BC Managed Care – PPO | Admitting: Sports Medicine

## 2020-11-11 DIAGNOSIS — D485 Neoplasm of uncertain behavior of skin: Secondary | ICD-10-CM | POA: Diagnosis not present

## 2020-11-11 DIAGNOSIS — D225 Melanocytic nevi of trunk: Secondary | ICD-10-CM | POA: Diagnosis not present

## 2020-11-11 DIAGNOSIS — L570 Actinic keratosis: Secondary | ICD-10-CM | POA: Diagnosis not present

## 2020-11-11 DIAGNOSIS — L820 Inflamed seborrheic keratosis: Secondary | ICD-10-CM | POA: Diagnosis not present

## 2020-11-11 DIAGNOSIS — D2239 Melanocytic nevi of other parts of face: Secondary | ICD-10-CM | POA: Diagnosis not present

## 2021-02-03 ENCOUNTER — Ambulatory Visit (INDEPENDENT_AMBULATORY_CARE_PROVIDER_SITE_OTHER): Payer: BC Managed Care – PPO | Admitting: Family Medicine

## 2021-02-03 ENCOUNTER — Other Ambulatory Visit: Payer: Self-pay

## 2021-02-03 VITALS — BP 131/75 | HR 79 | Temp 98.3°F

## 2021-02-03 DIAGNOSIS — Z23 Encounter for immunization: Secondary | ICD-10-CM

## 2021-02-03 NOTE — Progress Notes (Signed)
Patient presents today for shingles immunization. This is the 2nd of 2 injections.   Patient denies CP, palpitations, ShOB, abd pain, headache, mood swings.   Immunization given IM in LD. Pt tolerated immunization well without complications. Advised pt to contact us should any complications arise.

## 2021-02-03 NOTE — Patient Instructions (Signed)
Recombinant Zoster (Shingles) Vaccine: What You Need to Know 1. Why get vaccinated? Recombinant zoster (shingles) vaccine can prevent shingles. Shingles (also called herpes zoster, or just zoster) is a painful skin rash, usually with blisters. In addition to the rash, shingles can cause fever, headache, chills, or upset stomach. More rarely, shingles can lead to pneumonia, hearingproblems, blindness, brain inflammation (encephalitis), or death. The most common complication of shingles is long-term nerve pain called postherpetic neuralgia (PHN). PHN occurs in the areas where the shingles rash was, even after the rash clears up. It can last for months or years after therash goes away. The pain from PHN can be severe and debilitating. About 10 to 18% of people who get shingles will experience PHN. The risk of PHN increases with age. An older adult with shingles is more likely to develop PHN and have longer lasting and more severe pain than a younger person withshingles. Shingles is caused by the varicella zoster virus, the same virus that causes chickenpox. After you have chickenpox, the virus stays in your body and can cause shingles later in life. Shingles cannot be passed from one person to another, but the virus that causes shingles can spread and cause chickenpox insomeone who had never had chickenpox or received chickenpox vaccine. 2. Recombinant shingles vaccine Recombinant shingles vaccine provides strong protection against shingles. Bypreventing shingles, recombinant shingles vaccine also protects against PHN. Recombinant shingles vaccine is the preferred vaccine for the prevention of shingles. However, a different vaccine, live shingles vaccine, may be used in somecircumstances. The recombinant shingles vaccine is recommended for adults 50 years and older without serious immune problems. It is given as a two-dose series. This vaccine is also recommended for people who have already gotten another  type of shingles vaccine, the live shingles vaccine. There is no live virus inthis vaccine. Shingles vaccine may be given at the same time as other vaccines. 3. Talk with your health care provider Tell your vaccine provider if the person getting the vaccine: Has had an allergic reaction after a previous dose of recombinant shingles vaccine, or has any severe, life-threatening allergies. Is pregnant or breastfeeding. Is currently experiencing an episode of shingles. In some cases, your health care provider may decide to postpone shinglesvaccination to a future visit. People with minor illnesses, such as a cold, may be vaccinated. People who are moderately or severely ill should usually wait until they recover beforegetting recombinant shingles vaccine. Your health care provider can give you more information. 4. Risks of a vaccine reaction A sore arm with mild or moderate pain is very common after recombinant shingles vaccine, affecting about 80% of vaccinated people. Redness and swelling can also happen at the site of the injection. Tiredness, muscle pain, headache, shivering, fever, stomach pain, and nausea happen after vaccination in more than half of people who receive recombinant shingles vaccine. In clinical trials, about 1 out of 6 people who got recombinant zoster vaccine experienced side effects that prevented them from doing regular activities.Symptoms usually went away on their own in 2 to 3 days. You should still get the second dose of recombinant zoster vaccine even if youhad one of these reactions after the first dose. People sometimes faint after medical procedures, including vaccination. Tellyour provider if you feel dizzy or have vision changes or ringing in the ears. As with any medicine, there is a very remote chance of a vaccine causing asevere allergic reaction, other serious injury, or death. 5. What if there is a serious problem? An   allergic reaction could occur after the  vaccinated person leaves the clinic. If you see signs of a severe allergic reaction (hives, swelling of the face and throat, difficulty breathing, a fast heartbeat, dizziness, or weakness), call 9-1-1 and get the person to the nearest hospital. For other signs that concern you, call your health care provider. Adverse reactions should be reported to the Vaccine Adverse Event Reporting System (VAERS). Your health care provider will usually file this report, or you can do it yourself. Visit the VAERS website at www.vaers.hhs.gov or call 1-800-822-7967. VAERS is only for reporting reactions, and VAERS staff do not give medical advice. 6. How can I learn more? Ask your health care provider. Call your local or state health department. Contact the Centers for Disease Control and Prevention (CDC): Call 1-800-232-4636 (1-800-CDC-INFO) or Visit CDC's website at www.cdc.gov/vaccines Vaccine Information Statement Recombinant Zoster Vaccine (04/24/2018) This information is not intended to replace advice given to you by your health care provider. Make sure you discuss any questions you have with your healthcare provider. Document Revised: 02/13/2020 Document Reviewed: 02/13/2020 Elsevier Patient Education  2022 [employer] Inc.  

## 2021-02-15 DIAGNOSIS — L570 Actinic keratosis: Secondary | ICD-10-CM | POA: Diagnosis not present

## 2021-02-15 DIAGNOSIS — C44319 Basal cell carcinoma of skin of other parts of face: Secondary | ICD-10-CM | POA: Diagnosis not present

## 2021-02-15 DIAGNOSIS — C44519 Basal cell carcinoma of skin of other part of trunk: Secondary | ICD-10-CM | POA: Diagnosis not present

## 2021-02-21 ENCOUNTER — Telehealth: Payer: BC Managed Care – PPO | Admitting: Family Medicine

## 2021-02-21 DIAGNOSIS — M25474 Effusion, right foot: Secondary | ICD-10-CM

## 2021-02-21 MED ORDER — INDOMETHACIN 50 MG PO CAPS: 50.0000 mg | ORAL_CAPSULE | Freq: Three times a day (TID) | ORAL | 0 refills | Status: AC

## 2021-02-21 NOTE — Progress Notes (Signed)
E-Visit for Gout Symptoms  We are sorry that you are not feeling well. We are here to help!  Based on what you shared with me it looks like you have a flare of your gout.  Gout is a form of arthritis. It can cause pain and swelling in the joints. At first, it tends to affect only 1 joint - most frequently the big toe. It happens in people who have too much uric acid in the blood. Uric acid is a chemical that is produced when the body breaks down certain foods. Uric acid can form sharp needle-like crystals that build up in the joints and cause pain. Uric acid crystals can also form inside the tubes that carry urine from the kidneys to the bladder. These crystals can turn into "kidney stones" that can cause pain and problems with the flow of urine. People with gout get sudden "flares" or attacks of severe pain, most often the big toe, ankle, or knee. Often the joint also turns red and swells. Usually, only 1 joint is affected, but some people have pain in more than 1 joint. Gout flares tend to happen more often during the night.  The pain from gout can be extreme. The pain and swelling are worst at the beginning of a gout flare. The symptoms then get better within a few days to weeks. It is not clear how the body "turns off" a gout flare.  Do not start any NEW preventative medicine until the gout has cleared completely. However, If you are already on Probenecid or Allopurinol for CHRONIC gout, you may continue taking this during an active flare up  I have prescribed Indomethacin '50mg'$  three times daily for moderate to severe pain for no more than 7 days   HOME CARE Losing weight can help relieve gout. It's not clear that following a specific diet plan will help with gout symptoms but eating a balanced diet can help improve your overall health. It can also help you lose weight, if you are overweight. In general, a healthy diet includes plenty of fruits, vegetables, whole grains, and low-fat dairy  products (labelled "low fat", skim, 2%). Avoid sugar sweetened drinks (including sodas, tea, juice and juice blends, coffee drinks and sports drinks) Limit alcohol to 1-2 drinks of beer, spirits or wine daily these can make gout flares worse. Some people with gout also have other health problems, such as heart disease, high blood pressure, kidney disease, or obesity. If you have any of these issues, it's important to work with your doctor to manage them. This can help improve your overall health and might also help with your gout.  GET HELP RIGHT AWAY IF: Your symptoms persist after you have completed your treatment plan You develop severe diarrhea You develop abnormal sensations  You develop vomiting,   You develop weakness  You develop abdominal pain  FOLLOW UP WITH YOUR PRIMARY PROVIDER IF: If your symptoms do not improve within 10 days  MAKE SURE YOU  Understand these instructions. Will watch your condition. Will get help right away if you are not doing well or get worse.  Thank you for choosing an e-visit.  Your e-visit answers were reviewed by a board certified advanced clinical practitioner to complete your personal care plan. Depending upon the condition, your plan could have included both over the counter or prescription medications.  Please review your pharmacy choice. Make sure the pharmacy is open so you can pick up prescription now. If there is a problem,  you may contact your provider through [employer] and have the prescription routed to another pharmacy.  Your safety is important to [place]. If you have drug allergies check your prescription carefully.   For the next 24 hours you can use MyChart to ask questions about today's visit, request a non-urgent call back, or ask for a work or school excuse. You will get an email in the next two days asking about your experience. I hope that your e-visit has been valuable and will speed your recovery.   I provided 5 minutes of  non face-to-face time during this encounter for chart review, medication and order placement, as well as and documentation.

## 2021-05-20 DIAGNOSIS — R0981 Nasal congestion: Secondary | ICD-10-CM | POA: Diagnosis not present

## 2021-05-20 DIAGNOSIS — B9689 Other specified bacterial agents as the cause of diseases classified elsewhere: Secondary | ICD-10-CM | POA: Diagnosis not present

## 2021-05-20 DIAGNOSIS — J028 Acute pharyngitis due to other specified organisms: Secondary | ICD-10-CM | POA: Diagnosis not present

## 2021-05-20 DIAGNOSIS — R52 Pain, unspecified: Secondary | ICD-10-CM | POA: Diagnosis not present

## 2021-09-06 ENCOUNTER — Ambulatory Visit (INDEPENDENT_AMBULATORY_CARE_PROVIDER_SITE_OTHER): Payer: BC Managed Care – PPO | Admitting: Sports Medicine

## 2021-09-06 ENCOUNTER — Other Ambulatory Visit: Payer: Self-pay

## 2021-09-06 ENCOUNTER — Ambulatory Visit (INDEPENDENT_AMBULATORY_CARE_PROVIDER_SITE_OTHER): Payer: BC Managed Care – PPO

## 2021-09-06 DIAGNOSIS — W19XXXA Unspecified fall, initial encounter: Secondary | ICD-10-CM

## 2021-09-06 DIAGNOSIS — M533 Sacrococcygeal disorders, not elsewhere classified: Secondary | ICD-10-CM

## 2021-09-06 DIAGNOSIS — M47816 Spondylosis without myelopathy or radiculopathy, lumbar region: Secondary | ICD-10-CM | POA: Diagnosis not present

## 2021-09-06 MED ORDER — PREDNISONE 50 MG PO TABS: ORAL_TABLET | ORAL | 0 refills | Status: DC

## 2021-09-06 NOTE — Assessment & Plan Note (Signed)
This is a very pleasant 51 year old male, he said about 3 months of pain right side coccyx after a fall. ?Worse when stepping up with the right side. ?On exam he does have tenderness discretely at the sacrococcygeal junction right-sided. ?Unfortunately this is worse with sitting and he does have a lot of flights for work including a flight to [place] at the end of April. ?We will start conservatively, x-rays of the sacrum/coccyx, 5 days of prednisone, he will need to use a donut pillow in the meantime, return to see me in approximately 3 weeks, if insufficient improvement we will proceed with an MRI and potentially sacrococcygeal or intercoccygeal disc injection. ?If he is still not better by the time he leaves for his trip to [place] we will give him some hydrocodone to ease his pain while on the plane. ?

## 2021-09-06 NOTE — Progress Notes (Signed)
? ? ?  Procedures performed today:   ? ?None. ? ?Independent interpretation of notes and tests performed by another provider:  ? ?None. ? ?Brief History, Exam, Impression, and Recommendations:   ? ?Traumatic coccydynia ?This is a very pleasant 51 year old male, he said about 3 months of pain right side coccyx after a fall. ?Worse when stepping up with the right side. ?On exam he does have tenderness discretely at the sacrococcygeal junction right-sided. ?Unfortunately this is worse with sitting and he does have a lot of flights for work including a flight to [place] at the end of April. ?We will start conservatively, x-rays of the sacrum/coccyx, 5 days of prednisone, he will need to use a donut pillow in the meantime, return to see me in approximately 3 weeks, if insufficient improvement we will proceed with an MRI and potentially sacrococcygeal or intercoccygeal disc injection. ?If he is still not better by the time he leaves for his trip to [place] we will give him some hydrocodone to ease his pain while on the plane. ? ?Chronic process with exacerbation and pharmacologic intervention ? ?___________________________________________ ?Ihor Austin. Benjamin Stain, M.D., ABFM., CAQSM. ?Primary Care and Sports Medicine ?[facility] ? ?Adjunct Instructor of Family Medicine  ?[employer] ?

## 2021-10-04 ENCOUNTER — Ambulatory Visit (INDEPENDENT_AMBULATORY_CARE_PROVIDER_SITE_OTHER): Payer: BC Managed Care – PPO | Admitting: Sports Medicine

## 2021-10-04 DIAGNOSIS — M533 Sacrococcygeal disorders, not elsewhere classified: Secondary | ICD-10-CM | POA: Diagnosis not present

## 2021-10-04 MED ORDER — PREDNISONE 50 MG PO TABS: ORAL_TABLET | ORAL | 0 refills | Status: DC

## 2021-10-04 MED ORDER — HYDROCODONE-ACETAMINOPHEN 5-325 MG PO TABS: 1.0000 | ORAL_TABLET | Freq: Three times a day (TID) | ORAL | 0 refills | Status: DC | PRN

## 2021-10-04 NOTE — Progress Notes (Signed)
? ? ?  Procedures performed today:   ? ?None. ? ?Independent interpretation of notes and tests performed by another provider:  ? ?None. ? ?Brief History, Exam, Impression, and Recommendations:   ? ?Traumatic coccydynia ?John returns, he is a very pleasant 51 year old male, he is now approximately 3-1/2 months post right-sided sacrococcygeal pain after a fall, worse with stepping up on the right. ?He did have tenderness discretely at the sacrococcygeal junction, right-sided. ?He also spends a lot of time sitting traveling, just finished a 10-hour car ride and in 2 weeks he will do a flight to [place]. ?X-rays showed a potential fracture at the tip of the coccyx however his pain clinically was more proximal. ?He did not yet take the prednisone so we will refill prednisone, I would also like to call him in some hydrocodone for his trip. ?Sometimes pain like this comes from the lumbar spine so the prednisone should be beneficial. ?If insufficient improvement after a month we will get an MRI and consider a intercoccygeal disc/sacrococcygeal disc injection. ? ?Chronic process with exacerbation and pharmacologic intervention ? ?___________________________________________ ?Ihor Austin. Benjamin Stain, M.D., ABFM., CAQSM. ?Primary Care and Sports Medicine ?[facility] ? ?Adjunct Instructor of Family Medicine  ?[employer] ?

## 2021-10-04 NOTE — Assessment & Plan Note (Signed)
Michael Callahan returns, he is a very pleasant 51 year old male, he is now approximately 3-1/2 months post right-sided sacrococcygeal pain after a fall, worse with stepping up on the right. ?He did have tenderness discretely at the sacrococcygeal junction, right-sided. ?He also spends a lot of time sitting traveling, just finished a 10-hour car ride and in 2 weeks he will do a flight to [place]. ?X-rays showed a potential fracture at the tip of the coccyx however his pain clinically was more proximal. ?He did not yet take the prednisone so we will refill prednisone, I would also like to call him in some hydrocodone for his trip. ?Sometimes pain like this comes from the lumbar spine so the prednisone should be beneficial. ?If insufficient improvement after a month we will get an MRI and consider a intercoccygeal disc/sacrococcygeal disc injection. ?

## 2021-10-27 ENCOUNTER — Telehealth: Payer: BC Managed Care – PPO | Admitting: Family Medicine

## 2021-11-07 ENCOUNTER — Ambulatory Visit: Payer: BC Managed Care – PPO | Admitting: Sports Medicine

## 2021-12-19 DIAGNOSIS — L570 Actinic keratosis: Secondary | ICD-10-CM | POA: Diagnosis not present

## 2021-12-19 DIAGNOSIS — D485 Neoplasm of uncertain behavior of skin: Secondary | ICD-10-CM | POA: Diagnosis not present

## 2021-12-19 DIAGNOSIS — L814 Other melanin hyperpigmentation: Secondary | ICD-10-CM | POA: Diagnosis not present

## 2021-12-19 DIAGNOSIS — L578 Other skin changes due to chronic exposure to nonionizing radiation: Secondary | ICD-10-CM | POA: Diagnosis not present

## 2022-01-09 DIAGNOSIS — J209 Acute bronchitis, unspecified: Secondary | ICD-10-CM | POA: Diagnosis not present

## 2022-01-31 ENCOUNTER — Telehealth: Payer: BC Managed Care – PPO | Admitting: Physician Assistant

## 2022-01-31 DIAGNOSIS — J019 Acute sinusitis, unspecified: Secondary | ICD-10-CM

## 2022-01-31 DIAGNOSIS — B9689 Other specified bacterial agents as the cause of diseases classified elsewhere: Secondary | ICD-10-CM

## 2022-01-31 MED ORDER — PREDNISONE 10 MG (21) PO TBPK
ORAL_TABLET | ORAL | 0 refills | Status: DC
Start: 2022-01-31 — End: 2022-06-28

## 2022-01-31 MED ORDER — AZITHROMYCIN 250 MG PO TABS: ORAL_TABLET | ORAL | 0 refills | Status: AC

## 2022-01-31 MED ORDER — FLUTICASONE PROPIONATE 50 MCG/ACT NA SUSP: 2.0000 | Freq: Every day | NASAL | 0 refills | Status: AC

## 2022-01-31 NOTE — Patient Instructions (Signed)
Dorthula Matas, thank you for joining [name], PA-C for today's virtual visit.  While this provider is not your primary care provider (PCP), if your PCP is located in our provider database this encounter information will be shared with them immediately following your visit.  Consent: (Patient) Michael Callahan provided verbal consent for this virtual visit at the beginning of the encounter.  Current Medications:  Current Outpatient Medications:    HYDROcodone-acetaminophen (NORCO/VICODIN) 5-325 MG tablet, Take 1 tablet by mouth every 8 (eight) hours as needed for moderate pain., Disp: 15 tablet, Rfl: 0   predniSONE (DELTASONE) 50 MG tablet, One tab PO daily for 5 days., Disp: 5 tablet, Rfl: 0   Medications ordered in this encounter:  No orders of the defined types were placed in this encounter.    *If you need refills on other medications prior to your next appointment, please contact your pharmacy*  Follow-Up: Call back or seek an in-person evaluation if the symptoms worsen or if the condition fails to improve as anticipated.  Other Instructions Please take antibiotic as directed.  Increase fluid intake.  Use Saline nasal spray.  Take a daily multivitamin. Take the steroid as directed and start the Flonase spray daily.  Place a humidifier in the bedroom.  Please call or return clinic if symptoms are not improving.  Sinusitis Sinusitis is redness, soreness, and swelling (inflammation) of the paranasal sinuses. Paranasal sinuses are air pockets within the bones of your face (beneath the eyes, the middle of the forehead, or above the eyes). In healthy paranasal sinuses, mucus is able to drain out, and air is able to circulate through them by way of your nose. However, when your paranasal sinuses are inflamed, mucus and air can become trapped. This can allow bacteria and other germs to grow and cause infection. Sinusitis can develop quickly and last only a short time (acute) or continue  over a long period (chronic). Sinusitis that lasts for more than 12 weeks is considered chronic.  CAUSES  Causes of sinusitis include: Allergies. Structural abnormalities, such as displacement of the cartilage that separates your nostrils (deviated septum), which can decrease the air flow through your nose and sinuses and affect sinus drainage. Functional abnormalities, such as when the small hairs (cilia) that line your sinuses and help remove mucus do not work properly or are not present. SYMPTOMS  Symptoms of acute and chronic sinusitis are the same. The primary symptoms are pain and pressure around the affected sinuses. Other symptoms include: Upper toothache. Earache. Headache. Bad breath. Decreased sense of smell and taste. A cough, which worsens when you are lying flat. Fatigue. Fever. Thick drainage from your nose, which often is green and may contain pus (purulent). Swelling and warmth over the affected sinuses. DIAGNOSIS  Your caregiver will perform a physical exam. During the exam, your caregiver may: Look in your nose for signs of abnormal growths in your nostrils (nasal polyps). Tap over the affected sinus to check for signs of infection. View the inside of your sinuses (endoscopy) with a special imaging device with a light attached (endoscope), which is inserted into your sinuses. If your caregiver suspects that you have chronic sinusitis, one or more of the following tests may be recommended: Allergy tests. Nasal culture A sample of mucus is taken from your nose and sent to a lab and screened for bacteria. Nasal cytology A sample of mucus is taken from your nose and examined by your caregiver to determine if your sinusitis  is related to an allergy. TREATMENT  Most cases of acute sinusitis are related to a viral infection and will resolve on their own within 10 days. Sometimes medicines are prescribed to help relieve symptoms (pain medicine, decongestants, nasal steroid  sprays, or saline sprays).  However, for sinusitis related to a bacterial infection, your caregiver will prescribe antibiotic medicines. These are medicines that will help kill the bacteria causing the infection.  Rarely, sinusitis is caused by a fungal infection. In theses cases, your caregiver will prescribe antifungal medicine. For some cases of chronic sinusitis, surgery is needed. Generally, these are cases in which sinusitis recurs more than 3 times per year, despite other treatments. HOME CARE INSTRUCTIONS  Drink plenty of water. Water helps thin the mucus so your sinuses can drain more easily. Use a humidifier. Inhale steam 3 to 4 times a day (for example, sit in the bathroom with the shower running). Apply a warm, moist washcloth to your face 3 to 4 times a day, or as directed by your caregiver. Use saline nasal sprays to help moisten and clean your sinuses. Take over-the-counter or prescription medicines for pain, discomfort, or fever only as directed by your caregiver. SEEK IMMEDIATE MEDICAL CARE IF: You have increasing pain or severe headaches. You have nausea, vomiting, or drowsiness. You have swelling around your face. You have vision problems. You have a stiff neck. You have difficulty breathing. MAKE SURE YOU:  Understand these instructions. Will watch your condition. Will get help right away if you are not doing well or get worse. Document Released: 06/12/2005 Document Revised: 09/04/2011 Document Reviewed: 06/27/2011 [employer] Patient Information 2014 [place], [place].    If you have been instructed to have an in-person evaluation today at a local Urgent Care facility, please use the link below. It will take you to a list of all of our available [facility], including address, phone number and hours of operation. Please do not delay care.  [facility]  If you or a family member do not have a primary care provider, use the link below to schedule  a visit and establish care. When you choose a [facility] primary care physician or advanced practice provider, you gain a long-term partner in health. Find a Primary Care Provider  Learn more about [facility] in-office and virtual care options:  - Get Care Now

## 2022-01-31 NOTE — Progress Notes (Signed)
Virtual Visit Consent   Michael Callahan, you are scheduled for a virtual visit with a [facility] provider today. Just as with appointments in the office, your consent must be obtained to participate. Your consent will be active for this visit and any virtual visit you may have with one of our providers in the next 365 days. If you have a MyChart account, a copy of this consent can be sent to you electronically.  As this is a virtual visit, video technology does not allow for your provider to perform a traditional examination. This may limit your provider's ability to fully assess your condition. If your provider identifies any concerns that need to be evaluated in person or the need to arrange testing (such as labs, EKG, etc.), we will make arrangements to do so. Although advances in technology are sophisticated, we cannot ensure that it will always work on either your end or our end. If the connection with a video visit is poor, the visit may have to be switched to a telephone visit. With either a video or telephone visit, we are not always able to ensure that we have a secure connection.  By engaging in this virtual visit, you consent to the provision of healthcare and authorize for your insurance to be billed (if applicable) for the services provided during this visit. Depending on your insurance coverage, you may receive a charge related to this service.  I need to obtain your verbal consent now. Are you willing to proceed with your visit today? Michael Callahan has provided verbal consent on 01/31/2022 for a virtual visit (video or telephone). [name], [place]  Date: 01/31/2022 4:13 PM  Virtual Visit via Video Note   I, [name], connected with  Michael Callahan  ([id number], 02/06/1971) on 01/31/22 at  4:00 PM EDT by a video-enabled telemedicine application and verified that I am speaking with the correct person using two identifiers.  Location: Patient: Virtual Visit Location Patient:  Home Provider: Virtual Visit Location Provider: Home Office   I discussed the limitations of evaluation and management by telemedicine and the availability of in person appointments. The patient expressed understanding and agreed to proceed.    History of Present Illness: Michael Callahan is a 51 y.o. who identifies as a male who was assigned male at birth, and is being seen today for ongoing URI symptoms over the past 8 weeks. Initially starting out as nasal and head congestion. Thought was allergy-related as symptoms would wax and wane but now increased congestion and nasal discharge, substantial PND causing irritation and blistering at the back of the throat.  Notes odynophagia requiring Advil BID. Denies fever, chills. Denies sick contact. Is s/p tonsillectomy due to history of strep in youth.   HPI: HPI  Problems:  Patient Active Problem List   Diagnosis Date Noted   Traumatic coccydynia 09/06/2021   Well adult exam 10/04/2020   Swelling of first metatarsophalangeal (MTP) joint of right foot 09/17/2020   White coat syndrome without diagnosis of hypertension 02/21/2019   Class 1 obesity due to excess calories with serious comorbidity and body mass index (BMI) of 30.0 to 30.9 in adult 01/24/2019   Acute low back pain 03/13/2018   Family history of prostate cancer 02/08/2017   Hypertriglyceridemia 02/08/2017   Overweight (BMI 25.0-29.9) 02/07/2017   History of nonmelanoma skin cancer 02/07/2017   Family history of stroke or transient ischemic attack in mother 02/07/2017   Lower urinary tract obstructive syndrome  02/07/2017   Bilateral shoulder pain 12/05/2016   Prepatellar bursitis, right knee 09/04/2016   Primary osteoarthritis of both hands 03/21/2016   Seasonal allergies 03/24/2013    Allergies:  Allergies  Allergen Reactions   Doxycycline Nausea And Vomiting   Other Other (See Comments)    Band-aids   Penicillins Rash   Medications:  Current Outpatient Medications:     azithromycin (ZITHROMAX) 250 MG tablet, Take 2 tablets on day 1, then 1 tablet daily on days 2 through 5, Disp: 6 tablet, Rfl: 0   fluticasone (FLONASE) 50 MCG/ACT nasal spray, Place 2 sprays into both nostrils daily., Disp: 16 g, Rfl: 0   predniSONE (STERAPRED UNI-PAK 21 TAB) 10 MG (21) TBPK tablet, Take following package directions., Disp: 21 tablet, Rfl: 0   HYDROcodone-acetaminophen (NORCO/VICODIN) 5-325 MG tablet, Take 1 tablet by mouth every 8 (eight) hours as needed for moderate pain., Disp: 15 tablet, Rfl: 0  Observations/Objective: Patient is well-developed, well-nourished in no acute distress.  Resting comfortably at home.  Head is normocephalic, atraumatic.  No labored breathing. Speech is clear and coherent with logical content.  Patient is alert and oriented at baseline.  Unable to fully visualize oropharynx via video.  Assessment and Plan: 1. Acute bacterial sinusitis - azithromycin (ZITHROMAX) 250 MG tablet; Take 2 tablets on day 1, then 1 tablet daily on days 2 through 5  Dispense: 6 tablet; Refill: 0 - predniSONE (STERAPRED UNI-PAK 21 TAB) 10 MG (21) TBPK tablet; Take following package directions.  Dispense: 21 tablet; Refill: 0 - fluticasone (FLONASE) 50 MCG/ACT nasal spray; Place 2 sprays into both nostrils daily.  Dispense: 16 g; Refill: 0  Concern for chronic rhinitis turned into a festering sinusitis with sore throat 2/2 pnd. Increase fluids. Ok to continue Mucinex-D. Avoid Advil for now. Start Prednisone tablet pack. Fluticasone once daily. Will initiate ABX therapy with azithromycin giving his antibiotic allergies and intolerances. Will require in-person evaluation for any non-resolving, worsening or recurring symptoms. Recommend he also start an ongoing daily OTC antihistamine.   Follow Up Instructions: I discussed the assessment and treatment plan with the patient. The patient was provided an opportunity to ask questions and all were answered. The patient agreed with  the plan and demonstrated an understanding of the instructions.  A copy of instructions were sent to the patient via MyChart unless otherwise noted below.   The patient was advised to call back or seek an in-person evaluation if the symptoms worsen or if the condition fails to improve as anticipated.  Time:  I spent 10 minutes with the patient via telehealth technology discussing the above problems/concerns.    [name], PA-C

## 2022-06-11 DIAGNOSIS — B9689 Other specified bacterial agents as the cause of diseases classified elsewhere: Secondary | ICD-10-CM | POA: Diagnosis not present

## 2022-06-11 DIAGNOSIS — J069 Acute upper respiratory infection, unspecified: Secondary | ICD-10-CM | POA: Diagnosis not present

## 2022-06-28 ENCOUNTER — Ambulatory Visit (INDEPENDENT_AMBULATORY_CARE_PROVIDER_SITE_OTHER): Payer: BC Managed Care – PPO | Admitting: Family Medicine

## 2022-06-28 ENCOUNTER — Encounter: Payer: Self-pay | Admitting: Family Medicine

## 2022-06-28 VITALS — BP 142/85 | HR 95 | Ht 74.11 in | Wt 253.0 lb

## 2022-06-28 DIAGNOSIS — R0781 Pleurodynia: Secondary | ICD-10-CM | POA: Insufficient documentation

## 2022-06-28 LAB — D-DIMER, QUANTITATIVE: D-Dimer, Quant: <0.19 mcg/mL FEU (ref <0.50)

## 2022-06-28 NOTE — Progress Notes (Signed)
Pt states Rt Lower lung pain. Hx of pleurisy . States it feels the same.   Pt has completed all ABX meds. Rt ear had feeling of fullness. Issue had gotten better. Now is getting worse again. Sore throat from sinus drainage. Coughing throughout the night. Original symptoms started 05/25/2022.

## 2022-06-28 NOTE — Patient Instructions (Addendum)
We'll be in touch with lab results.  I would try the prednisone Warm salt water gargles for the sore throat and nasal saline rinses for congestion/post nasal drainage.

## 2022-06-28 NOTE — Progress Notes (Signed)
Michael Callahan - 52 y.o. male MRN [id number]  Date of birth: 06/17/1971  Subjective Chief Complaint  Patient presents with   URI    HPI Michael Callahan is a 52 year old male here today with complaint of pain along the right rib cage as well as right ureter and sore throat.  He had COVID in late November with persistent cough afterwards.  Also with pain along the right ribs when taking a deep breath.  Fullness in the right ear over the past couple weeks.  Has had some postnasal drainage and sore throat associated with this.  Previous history of pleurisy and symptoms feel similar.  Denies fever or chills.  Cough is actually improved over the past few days.  Denies shortness of breath or wheezing.  He has completed course of doxycycline.  He was prescribed prednisone previously but did not take this.  ROS:  A comprehensive ROS was completed and negative except as noted per HPI  Allergies  Allergen Reactions   Doxycycline Nausea And Vomiting   Other Other (See Comments)    Band-aids   Penicillins Rash    Past Medical History:  Diagnosis Date   Bilateral shoulder pain 12/05/2016   Borderline hyperlipidemia 02/08/2017   Hyperglycemia 12/02/2014   A1c normal June 2016    Prepatellar bursitis, right knee 09/04/2016   Primary osteoarthritis of both hands 03/21/2016   Seasonal allergies 03/24/2013    Past Surgical History:  Procedure Laterality Date   HEAD & NECK SKIN LESION EXCISIONAL BIOPSY      Social History   Socioeconomic History   Marital status: Married    Spouse name: Not on file   Number of children: Not on file   Years of education: Not on file   Highest education level: Not on file  Occupational History   Not on file  Tobacco Use   Smoking status: Never   Smokeless tobacco: Never  Vaping Use   Vaping Use: Never used  Substance and Sexual Activity   Alcohol use: Yes    Comment: <3 drinks per month   Drug use: No   Sexual activity: Yes    Partners: Female    Birth  control/protection: None  Other Topics Concern   Not on file  Social History Narrative   Not on file   Social Determinants of Health   Financial Resource Strain: Not on file  Food Insecurity: Not on file  Transportation Needs: Not on file  Physical Activity: Not on file  Stress: Not on file  Social Connections: Not on file    Family History  Problem Relation Age of Onset   Alcoholism Father        grandfather   Brain cancer Other        uncle   Stroke Mother    Dementia Mother    Prostate cancer Maternal Grandfather     Health Maintenance  Topic Date Due   HIV Screening  Never done   Hepatitis C Screening  Never done   Fecal DNA (Cologuard)  03/17/2022   INFLUENZA VACCINE  09/24/2022 (Originally 01/24/2022)   COVID-19 Vaccine (1) 07/15/2023 (Originally 07/11/1971)   DTaP/Tdap/Td (2 - Td or Tdap) 10/25/2024   Zoster Vaccines- Shingrix  Completed   HPV VACCINES  Aged Out     ----------------------------------------------------------------------------------------------------------------------------------------------------------------------------------------------------------------- Physical Exam BP (!) 142/85 (BP Location: Left Arm, Patient Position: Sitting, Cuff Size: Large)   Pulse 95   Ht 6' 2.11" (1.882 m)   Wt 253 lb (114.8 kg)  SpO2 95%   BMI 32.39 kg/m   Physical Exam Constitutional:      Appearance: Normal appearance.  HENT:     Head: Normocephalic and atraumatic.  Eyes:     General: No scleral icterus. Cardiovascular:     Rate and Rhythm: Normal rate and regular rhythm.  Pulmonary:     Effort: Pulmonary effort is normal.     Breath sounds: Normal breath sounds.  Musculoskeletal:     Cervical back: Neck supple.  Neurological:     Mental Status: He is alert.  Psychiatric:        Mood and Affect: Mood normal.        Behavior: Behavior normal.      ------------------------------------------------------------------------------------------------------------------------------------------------------------------------------------------------------------------- Assessment and Plan  Pleuritic chest pain Symptoms started around the time he had COVID.  He has completed doxycycline without much improvement.  Recommend that he go ahead and add prednisone.  Checking D-dimer with recent COVID.   No orders of the defined types were placed in this encounter.   No follow-ups on file.    This visit occurred during the SARS-CoV-2 public health emergency.  Safety protocols were in place, including screening questions prior to the visit, additional usage of staff PPE, and extensive cleaning of exam room while observing appropriate contact time as indicated for disinfecting solutions.

## 2022-06-28 NOTE — Assessment & Plan Note (Signed)
Symptoms started around the time he had COVID.  He has completed doxycycline without much improvement.  Recommend that he go ahead and add prednisone.  Checking D-dimer with recent COVID.

## 2022-07-06 ENCOUNTER — Encounter: Payer: Self-pay | Admitting: Family Medicine

## 2022-07-06 MED ORDER — CEFDINIR 300 MG PO CAPS: 300.0000 mg | ORAL_CAPSULE | Freq: Two times a day (BID) | ORAL | 0 refills | Status: DC

## 2022-10-05 ENCOUNTER — Ambulatory Visit (INDEPENDENT_AMBULATORY_CARE_PROVIDER_SITE_OTHER): Payer: BC Managed Care – PPO | Admitting: Family Medicine

## 2022-10-05 ENCOUNTER — Encounter: Payer: Self-pay | Admitting: Family Medicine

## 2022-10-05 VITALS — BP 155/104 | HR 62 | Ht 74.11 in | Wt 252.0 lb

## 2022-10-05 DIAGNOSIS — N410 Acute prostatitis: Secondary | ICD-10-CM | POA: Diagnosis not present

## 2022-10-05 DIAGNOSIS — N419 Inflammatory disease of prostate, unspecified: Secondary | ICD-10-CM | POA: Insufficient documentation

## 2022-10-05 DIAGNOSIS — R8290 Unspecified abnormal findings in urine: Secondary | ICD-10-CM | POA: Diagnosis not present

## 2022-10-05 LAB — POCT URINALYSIS DIP (CLINITEK)
Bilirubin, UA: NEGATIVE
Blood, UA: NEGATIVE
Glucose, UA: NEGATIVE mg/dL
Ketones, POC UA: NEGATIVE mg/dL
Nitrite, UA: NEGATIVE
POC PROTEIN,UA: NEGATIVE
Spec Grav, UA: 1.025 (ref 1.010–1.025)
Urobilinogen, UA: 0.2 E.U./dL
pH, UA: 6 (ref 5.0–8.0)

## 2022-10-05 MED ORDER — SULFAMETHOXAZOLE-TRIMETHOPRIM 800-160 MG PO TABS: 1.0000 | ORAL_TABLET | Freq: Two times a day (BID) | ORAL | 0 refills | Status: AC

## 2022-10-05 MED ORDER — TAMSULOSIN HCL 0.4 MG PO CAPS: 0.4000 mg | ORAL_CAPSULE | Freq: Every day | ORAL | 0 refills | Status: AC

## 2022-10-05 NOTE — Patient Instructions (Signed)
Prostatitis  Prostatitis is swelling of the prostate gland, also called the prostate. This gland is about 1.5 inches wide and 1 inch high, and it helps to make a fluid called semen. The prostate is below a man's bladder, in front of the butt (rectum). There are different types of prostatitis. What are the causes? One type of prostatitis is caused by an infection from germs (bacteria). Another type is not caused by germs. It may be caused by: Things having to do with the nervous system. This system includes thebrain, spinal cord, and nerves. An autoimmune response. This happens when the body's disease-fighting system attacks healthy tissue in the body by mistake. Psychological factors. These have to do with how the mind works. The causes of other types of prostatitis are normally not known. What are the signs or symptoms? Symptoms of this condition depend on the type of prostatitis you have. If your condition is caused by germs: You may feel pain or burning when you pee (urinate). You may pee often and all of a sudden. You may have problems starting to pee. You may have trouble emptying your bladder when you pee. You may have fever or chills. You may feel pain in your muscles, joints, low back, or lower belly. If you have other types of prostatitis: You may pee often or all of a sudden. You may have trouble starting to pee. You may have a weak flow when you pee. You may leak pee after using the bathroom. You may have other problems, such as: Abnormal fluid coming from the penis. Pain in the testicles or penis. Pain between the butt and the testicles. Pain when fluid comes out of the penis during sex. How is this treated? Treatment for this condition depends on the type of prostatitis. Treatment may include: Medicines. These may treat pain or swelling, or they may help relax muscles. Exercises to help you move better or get stronger (physical therapy). Heat therapy. Techniques to help  you control some of the ways that your body works. Exercises to help you relax. Antibiotic medicine, if your condition is caused by germs. Warm water baths (sitz baths) to relax muscles. Follow these instructions at home: Medicines Take over-the-counter and prescription medicines only as told by your doctor. If you were prescribed an antibiotic medicine, take it as told by your doctor. Do not stop using the antibiotic even if you start to feel better. Managing pain and swelling  Take sitz baths as told by your doctor. For a sitz bath, sit in warm water that is deep enough to cover your hips and butt. If told, put heat on the painful area. Do this as often as told by your doctor. Use the heat source that your doctor recommends, such as a moist heat pack or a heating pad. Place a towel between your skin and the heat source. Leave the heat on for 20-30 minutes. Take off the heat if your skin turns bright red. This is very important if you are unable to feel pain, heat, or cold. You may have a greater risk of getting burned. General instructions Do exercises as told by your doctor, if your doctor prescribed them. Keep all follow-up visits as told by your doctor. This is important. Where to find more information National Institute of Diabetes and Digestive and Kidney Diseases: https://www.niddk.nih.gov Contact a doctor if: Your symptoms get worse. You have a fever. Get help right away if: You have chills. You feel light-headed. You feel like you may   faint. You cannot pee. You have blood or clumps of blood (blood clots) in your pee. Summary Prostatitis is swelling of the prostate gland. There are different types of prostatitis. Treatment depends on the type that you have. Take over-the-counter and prescription medicines only as told by your doctor. Get help right away of you have chills, feel light-headed, or feel like you may faint. Also get help right away if you cannot pee or you have  blood or clumps of blood in your pee. This information is not intended to replace advice given to you by your health care provider. Make sure you discuss any questions you have with your health care provider. Document Revised: 04/27/2022 Document Reviewed: 04/27/2022 Elsevier Patient Education  2023 [employer].

## 2022-10-05 NOTE — Progress Notes (Signed)
Michael Callahan - 52 y.o. male MRN [id number]  Date of birth: 1971-02-28  Subjective Chief Complaint  Patient presents with   Flank Pain   Urinary Frequency    HPI Michael Callahan is a 52 year old male here today with complaint of lower back, perineal pain and testicular pain.  He is having trouble voiding with slow comfortable urination.  He has had prostatitis before and symptoms are similar.  He has not noted any blood in his urine.  He denies fever or chills.  Bowels are moving normally.  ROS:  A comprehensive ROS was completed and negative except as noted per HPI    Allergies  Allergen Reactions   Doxycycline Nausea And Vomiting   Other Other (See Comments)    Band-aids   Penicillins Rash    Past Medical History:  Diagnosis Date   Bilateral shoulder pain 12/05/2016   Borderline hyperlipidemia 02/08/2017   Hyperglycemia 12/02/2014   A1c normal June 2016    Prepatellar bursitis, right knee 09/04/2016   Primary osteoarthritis of both hands 03/21/2016   Seasonal allergies 03/24/2013    Past Surgical History:  Procedure Laterality Date   HEAD & NECK SKIN LESION EXCISIONAL BIOPSY      Social History   Socioeconomic History   Marital status: Married    Spouse name: Not on file   Number of children: Not on file   Years of education: Not on file   Highest education level: Not on file  Occupational History   Not on file  Tobacco Use   Smoking status: Never   Smokeless tobacco: Never  Vaping Use   Vaping Use: Never used  Substance and Sexual Activity   Alcohol use: Yes    Comment: <3 drinks per month   Drug use: No   Sexual activity: Yes    Partners: Female    Birth control/protection: None  Other Topics Concern   Not on file  Social History Narrative   Not on file   Social Determinants of Health   Financial Resource Strain: Not on file  Food Insecurity: Not on file  Transportation Needs: Not on file  Physical Activity: Not on file  Stress: Not on file  Social  Connections: Not on file    Family History  Problem Relation Age of Onset   Alcoholism Father        grandfather   Brain cancer Other        uncle   Stroke Mother    Dementia Mother    Prostate cancer Maternal Grandfather     Health Maintenance  Topic Date Due   Fecal DNA (Cologuard)  04/06/2023 (Originally 03/17/2022)   COVID-19 Vaccine (1) 07/15/2023 (Originally 07/11/1971)   Hepatitis C Screening  10/05/2023 (Originally 01/09/1987)   HIV Screening  10/05/2023 (Originally 01/09/1984)   INFLUENZA VACCINE  01/25/2023   DTaP/Tdap/Td (2 - Td or Tdap) 10/25/2024   Zoster Vaccines- Shingrix  Completed   HPV VACCINES  Aged Out     ----------------------------------------------------------------------------------------------------------------------------------------------------------------------------------------------------------------- Physical Exam BP (!) 155/104 (BP Location: Left Arm, Patient Position: Standing, Cuff Size: Large)   Pulse 62   Ht 6' 2.11" (1.882 m)   Wt 252 lb (114.3 kg)   SpO2 97%   BMI 32.26 kg/m   Physical Exam Constitutional:      Appearance: Normal appearance.  HENT:     Head: Normocephalic and atraumatic.  Eyes:     General: No scleral icterus. Neurological:     General: No focal deficit present.  Mental Status: He is alert.  Psychiatric:        Mood and Affect: Mood normal.        Behavior: Behavior normal.     ------------------------------------------------------------------------------------------------------------------------------------------------------------------------------------------------------------------- Assessment and Plan  Prostatitis Checking PSA, CBC and urinalysis.  Adding course of Flomax as well as treated with course of Bactrim.  He will let me know if symptoms are worsening.  He does have upcoming appoint with urology as well.   Meds ordered this encounter  Medications   sulfamethoxazole-trimethoprim (BACTRIM  DS) 800-160 MG tablet    Sig: Take 1 tablet by mouth 2 (two) times daily.    Dispense:  28 tablet    Refill:  0   tamsulosin (FLOMAX) 0.4 MG CAPS capsule    Sig: Take 1 capsule (0.4 mg total) by mouth daily.    Dispense:  30 capsule    Refill:  0    No follow-ups on file.    This visit occurred during the SARS-CoV-2 public health emergency.  Safety protocols were in place, including screening questions prior to the visit, additional usage of staff PPE, and extensive cleaning of exam room while observing appropriate contact time as indicated for disinfecting solutions.

## 2022-10-05 NOTE — Assessment & Plan Note (Signed)
Checking PSA, CBC and urinalysis.  Adding course of Flomax as well as treated with course of Bactrim.  He will let me know if symptoms are worsening.  He does have upcoming appoint with urology as well.

## 2022-10-06 LAB — CBC WITH DIFFERENTIAL/PLATELET
Absolute Monocytes: 362 cells/uL (ref 200–950)
Basophils Absolute: 51 cells/uL (ref 0–200)
Basophils Relative: 1 %
Eosinophils Absolute: 92 cells/uL (ref 15–500)
Eosinophils Relative: 1.8 %
HCT: 46.5 % (ref 38.5–50.0)
Hemoglobin: 16.2 g/dL (ref 13.2–17.1)
Lymphs Abs: 1530 cells/uL (ref 850–3900)
MCH: 33.5 pg — ABNORMAL HIGH (ref 27.0–33.0)
MCHC: 34.8 g/dL (ref 32.0–36.0)
MCV: 96.3 fL (ref 80.0–100.0)
MPV: 11.1 fL (ref 7.5–12.5)
Monocytes Relative: 7.1 %
Neutro Abs: 3065 cells/uL (ref 1500–7800)
Neutrophils Relative %: 60.1 %
Platelets: 237 10*3/uL (ref 140–400)
RBC: 4.83 10*6/uL (ref 4.20–5.80)
RDW: 11.8 % (ref 11.0–15.0)
Total Lymphocyte: 30 %
WBC: 5.1 10*3/uL (ref 3.8–10.8)

## 2022-10-06 LAB — BASIC METABOLIC PANEL
BUN: 13 mg/dL (ref 7–25)
CO2: 25 mmol/L (ref 20–32)
Calcium: 9.5 mg/dL (ref 8.6–10.3)
Chloride: 103 mmol/L (ref 98–110)
Creat: 1.02 mg/dL (ref 0.70–1.30)
Glucose, Bld: 98 mg/dL (ref 65–99)
Potassium: 4.3 mmol/L (ref 3.5–5.3)
Sodium: 137 mmol/L (ref 135–146)

## 2022-10-06 LAB — URINE CULTURE
MICRO NUMBER:: 14812543
Result:: NO GROWTH
SPECIMEN QUALITY:: ADEQUATE

## 2022-10-06 LAB — PSA: PSA: 1.63 ng/mL (ref <=4.00)

## 2022-10-07 DIAGNOSIS — N410 Acute prostatitis: Secondary | ICD-10-CM | POA: Diagnosis not present

## 2022-10-07 DIAGNOSIS — N451 Epididymitis: Secondary | ICD-10-CM | POA: Diagnosis not present

## 2022-10-26 DIAGNOSIS — N50811 Right testicular pain: Secondary | ICD-10-CM | POA: Diagnosis not present

## 2022-10-26 DIAGNOSIS — R3982 Chronic bladder pain: Secondary | ICD-10-CM | POA: Diagnosis not present

## 2022-10-26 DIAGNOSIS — N401 Enlarged prostate with lower urinary tract symptoms: Secondary | ICD-10-CM | POA: Diagnosis not present

## 2022-10-26 DIAGNOSIS — R3912 Poor urinary stream: Secondary | ICD-10-CM | POA: Diagnosis not present

## 2022-10-27 DIAGNOSIS — L570 Actinic keratosis: Secondary | ICD-10-CM | POA: Diagnosis not present

## 2022-10-27 DIAGNOSIS — C44519 Basal cell carcinoma of skin of other part of trunk: Secondary | ICD-10-CM | POA: Diagnosis not present

## 2022-10-27 DIAGNOSIS — C44311 Basal cell carcinoma of skin of nose: Secondary | ICD-10-CM | POA: Diagnosis not present

## 2022-11-01 ENCOUNTER — Encounter: Payer: BC Managed Care – PPO | Admitting: Family Medicine

## 2022-11-22 ENCOUNTER — Ambulatory Visit (INDEPENDENT_AMBULATORY_CARE_PROVIDER_SITE_OTHER): Payer: BC Managed Care – PPO | Admitting: Family Medicine

## 2022-11-22 ENCOUNTER — Encounter: Payer: Self-pay | Admitting: Family Medicine

## 2022-11-22 VITALS — BP 144/87 | HR 73 | Ht 74.11 in | Wt 251.0 lb

## 2022-11-22 DIAGNOSIS — M7741 Metatarsalgia, right foot: Secondary | ICD-10-CM

## 2022-11-22 MED ORDER — PREDNISONE 20 MG PO TABS
20.0000 mg | ORAL_TABLET | Freq: Every day | ORAL | 0 refills | Status: AC
Start: 2022-11-22 — End: 2022-11-27

## 2022-11-22 NOTE — Progress Notes (Signed)
   Acute Office Visit  Subjective:     Patient ID: Michael Callahan, male    DOB: 31-Jul-1970, 52 y.o.   MRN: [id number]  Chief Complaint  Patient presents with   Foot Swelling    Right foot swelling and pain.    HPI Patient is in today for R foot swelling and pain. He has been active playing tennis over the last few years.   Review of Systems  Constitutional:  Negative for chills and fever.  Respiratory:  Negative for cough and shortness of breath.   Cardiovascular:  Negative for chest pain.  Musculoskeletal:        Foot pain  Neurological:  Negative for headaches.        Objective:    BP (!) 144/87   Pulse 73   Ht 6' 2.11" (1.882 m)   Wt 251 lb (113.9 kg)   SpO2 96%   BMI 32.13 kg/m    Physical Exam Vitals and nursing note reviewed.  Constitutional:      General: He is not in acute distress.    Appearance: Normal appearance.  HENT:     Head: Normocephalic and atraumatic.     Right Ear: External ear normal.     Left Ear: External ear normal.     Nose: Nose normal.  Eyes:     Conjunctiva/sclera: Conjunctivae normal.  Cardiovascular:     Rate and Rhythm: Normal rate.  Pulmonary:     Effort: Pulmonary effort is normal.  Musculoskeletal:     Comments: Right foot: ball of foot tenderness to palpation of metatarsals   Neurological:     General: No focal deficit present.     Mental Status: He is alert and oriented to person, place, and time.  Psychiatric:        Mood and Affect: Mood normal.        Behavior: Behavior normal.        Thought Content: Thought content normal.        Judgment: Judgment normal.     No results found for any visits on 11/22/22.      Assessment & Plan:   Problem List Items Addressed This Visit       Other   Metatarsalgia of right foot - Primary    - provided pt with exercises as well as footpad cushioning recommendations - given patient a few days of steroids to get through his tennis matches this weekend. Recommended  increasing fluids and resting.      Relevant Medications   predniSONE (DELTASONE) 20 MG tablet    Meds ordered this encounter  Medications   predniSONE (DELTASONE) 20 MG tablet    Sig: Take 1 tablet (20 mg total) by mouth daily with breakfast for 5 days.    Dispense:  5 tablet    Refill:  0    Return if symptoms worsen or fail to improve.  Charlton Amor, DO

## 2022-11-22 NOTE — Assessment & Plan Note (Signed)
-   provided pt with exercises as well as footpad cushioning recommendations - given patient a few days of steroids to get through his tennis matches this weekend. Recommended increasing fluids and resting.

## 2022-11-22 NOTE — Patient Instructions (Addendum)
   You have metatarsalgia

## 2022-12-20 DIAGNOSIS — M7751 Other enthesopathy of right foot: Secondary | ICD-10-CM | POA: Diagnosis not present

## 2022-12-20 DIAGNOSIS — M79671 Pain in right foot: Secondary | ICD-10-CM | POA: Diagnosis not present

## 2022-12-20 DIAGNOSIS — M2041 Other hammer toe(s) (acquired), right foot: Secondary | ICD-10-CM | POA: Diagnosis not present

## 2023-02-06 ENCOUNTER — Ambulatory Visit: Payer: BC Managed Care – PPO | Admitting: Family Medicine

## 2023-04-18 ENCOUNTER — Ambulatory Visit: Payer: BC Managed Care – PPO | Admitting: Family Medicine

## 2023-04-19 ENCOUNTER — Ambulatory Visit: Payer: BC Managed Care – PPO

## 2023-04-19 ENCOUNTER — Ambulatory Visit: Payer: BC Managed Care – PPO | Admitting: Family Medicine

## 2023-04-19 ENCOUNTER — Encounter: Payer: Self-pay | Admitting: Family Medicine

## 2023-04-19 VITALS — BP 143/85 | HR 78 | Ht 74.11 in | Wt 256.0 lb

## 2023-04-19 DIAGNOSIS — N50812 Left testicular pain: Secondary | ICD-10-CM

## 2023-04-19 DIAGNOSIS — Z1211 Encounter for screening for malignant neoplasm of colon: Secondary | ICD-10-CM

## 2023-04-19 DIAGNOSIS — N50811 Right testicular pain: Secondary | ICD-10-CM | POA: Diagnosis not present

## 2023-04-19 DIAGNOSIS — R350 Frequency of micturition: Secondary | ICD-10-CM

## 2023-04-19 DIAGNOSIS — R1030 Lower abdominal pain, unspecified: Secondary | ICD-10-CM | POA: Diagnosis not present

## 2023-04-19 DIAGNOSIS — N503 Cyst of epididymis: Secondary | ICD-10-CM | POA: Diagnosis not present

## 2023-04-19 LAB — POCT URINALYSIS DIP (CLINITEK)
Bilirubin, UA: NEGATIVE
Glucose, UA: NEGATIVE mg/dL
Ketones, POC UA: NEGATIVE mg/dL
Leukocytes, UA: NEGATIVE
Nitrite, UA: NEGATIVE
POC PROTEIN,UA: NEGATIVE
Spec Grav, UA: >=1.03 — AB (ref 1.010–1.025)
Urobilinogen, UA: 0.2 U/dL
pH, UA: 6 (ref 5.0–8.0)

## 2023-04-21 LAB — URINE CULTURE: Organism ID, Bacteria: NO GROWTH

## 2023-04-22 DIAGNOSIS — N50811 Right testicular pain: Secondary | ICD-10-CM | POA: Insufficient documentation

## 2023-04-22 DIAGNOSIS — N50812 Left testicular pain: Secondary | ICD-10-CM | POA: Insufficient documentation

## 2023-04-22 NOTE — Progress Notes (Signed)
Michael Callahan - 52 y.o. male MRN [id number]  Date of birth: [birth year]  Subjective Chief Complaint  Patient presents with   colonoscopy discussion   prostate issue    HPI Michael Callahan is a 52 year old male here today with complaint of groin and testicular pain.  This started fairly recently.  Pain is in bilateral groin as well as bilateral testicles.  He has not noted any significant difficulty with urinating but has had some urinary frequency.  He has seen urology in the past after having an episode of suspected prostatitis and has an appointment with them again but not for a few weeks.  He has not noted any blood in his urine.  No bulges or masses concerning for hernia.  He also has questions about colon cancer screening.  He is leaning towards having a colonoscopy rather than Cologuard this year.  He would like to go ahead and have a referral for this.  ROS:  A comprehensive ROS was completed and negative except as noted per HPI  Allergies  Allergen Reactions   Doxycycline Nausea And Vomiting   Other Other (See Comments)    Band-aids   Penicillins Rash    Past Medical History:  Diagnosis Date   Bilateral shoulder pain 12/05/2016   Borderline hyperlipidemia 02/08/2017   Hyperglycemia 12/02/2014   A1c normal June 2016    Prepatellar bursitis, right knee 09/04/2016   Primary osteoarthritis of both hands 03/21/2016   Seasonal allergies 03/24/2013    Past Surgical History:  Procedure Laterality Date   HEAD & NECK SKIN LESION EXCISIONAL BIOPSY      Social History   Socioeconomic History   Marital status: Married    Spouse name: Not on file   Number of children: Not on file   Years of education: Not on file   Highest education level: Not on file  Occupational History   Not on file  Tobacco Use   Smoking status: Never   Smokeless tobacco: Never  Vaping Use   Vaping status: Never Used  Substance and Sexual Activity   Alcohol use: Yes    Comment: <3 drinks per month   Drug  use: No   Sexual activity: Yes    Partners: Female    Birth control/protection: None  Other Topics Concern   Not on file  Social History Narrative   Not on file   Social Determinants of Health   Financial Resource Strain: Not on file  Food Insecurity: Not on file  Transportation Needs: Not on file  Physical Activity: Not on file  Stress: Not on file  Social Connections: Unknown (11/05/2021)   Received from [facility], [facility]   Social Network    Social Network: Not on file    Family History  Problem Relation Age of Onset   Alcoholism Father        grandfather   Brain cancer Other        uncle   Stroke Mother    Dementia Mother    Prostate cancer Maternal Grandfather     Health Maintenance  Topic Date Due   Fecal DNA (Cologuard)  03/17/2022   INFLUENZA VACCINE  01/25/2023   COVID-19 Vaccine (1 - 2023-24 season) Never done   Hepatitis C Screening  10/05/2023 (Originally 01/09/1987)   HIV Screening  10/05/2023 (Originally 01/09/1984)   DTaP/Tdap/Td (2 - Td or Tdap) 10/25/2024   Zoster Vaccines- Shingrix  Completed   HPV VACCINES  Aged Out     -----------------------------------------------------------------------------------------------------------------------------------------------------------------------------------------------------------------  Physical Exam BP (!) 143/85 (BP Location: Left Arm, Patient Position: Sitting, Cuff Size: Large)   Pulse 78   Ht 6' 2.11" (1.882 m)   Wt 256 lb (116.1 kg)   SpO2 98%   BMI 32.77 kg/m   Physical Exam Constitutional:      Appearance: Normal appearance.  HENT:     Head: Normocephalic and atraumatic.  Eyes:     General: No scleral icterus. Cardiovascular:     Rate and Rhythm: Normal rate and regular rhythm.  Pulmonary:     Effort: Pulmonary effort is normal.     Breath sounds: Normal breath sounds.  Musculoskeletal:     Cervical back: Neck supple.  Neurological:     Mental Status: He is alert.   Psychiatric:        Mood and Affect: Mood normal.        Behavior: Behavior normal.     ------------------------------------------------------------------------------------------------------------------------------------------------------------------------------------------------------------------- Assessment and Plan  Pain in both testicles He is having pain in the groin and testicles.  Ultrasound testicles ordered as well as KUB to evaluate for possible stone as he is having some urinary frequency as well.  He does have an appointment with his urologist in a few weeks.   No orders of the defined types were placed in this encounter.   No follow-ups on file.    This visit occurred during the SARS-CoV-2 public health emergency.  Safety protocols were in place, including screening questions prior to the visit, additional usage of staff PPE, and extensive cleaning of exam room while observing appropriate contact time as indicated for disinfecting solutions.

## 2023-04-22 NOTE — Assessment & Plan Note (Signed)
He is having pain in the groin and testicles.  Ultrasound testicles ordered as well as KUB to evaluate for possible stone as he is having some urinary frequency as well.  He does have an appointment with his urologist in a few weeks.

## 2023-04-25 ENCOUNTER — Encounter: Payer: Self-pay | Admitting: Family Medicine

## 2023-04-25 MED ORDER — CIPROFLOXACIN HCL 500 MG PO TABS: 500.0000 mg | ORAL_TABLET | Freq: Two times a day (BID) | ORAL | 0 refills | Status: AC

## 2023-05-13 IMAGING — DX DG SACRUM/COCCYX 2+V
3 series · 3 of 3 positions shown · non-contrast
Comparison: MRI lumbar spine 03/13/2018. Lumbar spine series
03/13/2018.

CLINICAL DATA: History of sacrococcygeal pain after fall.

EXAM:
SACRUM AND COCCYX - 2+ VIEW

[coccyx ap]
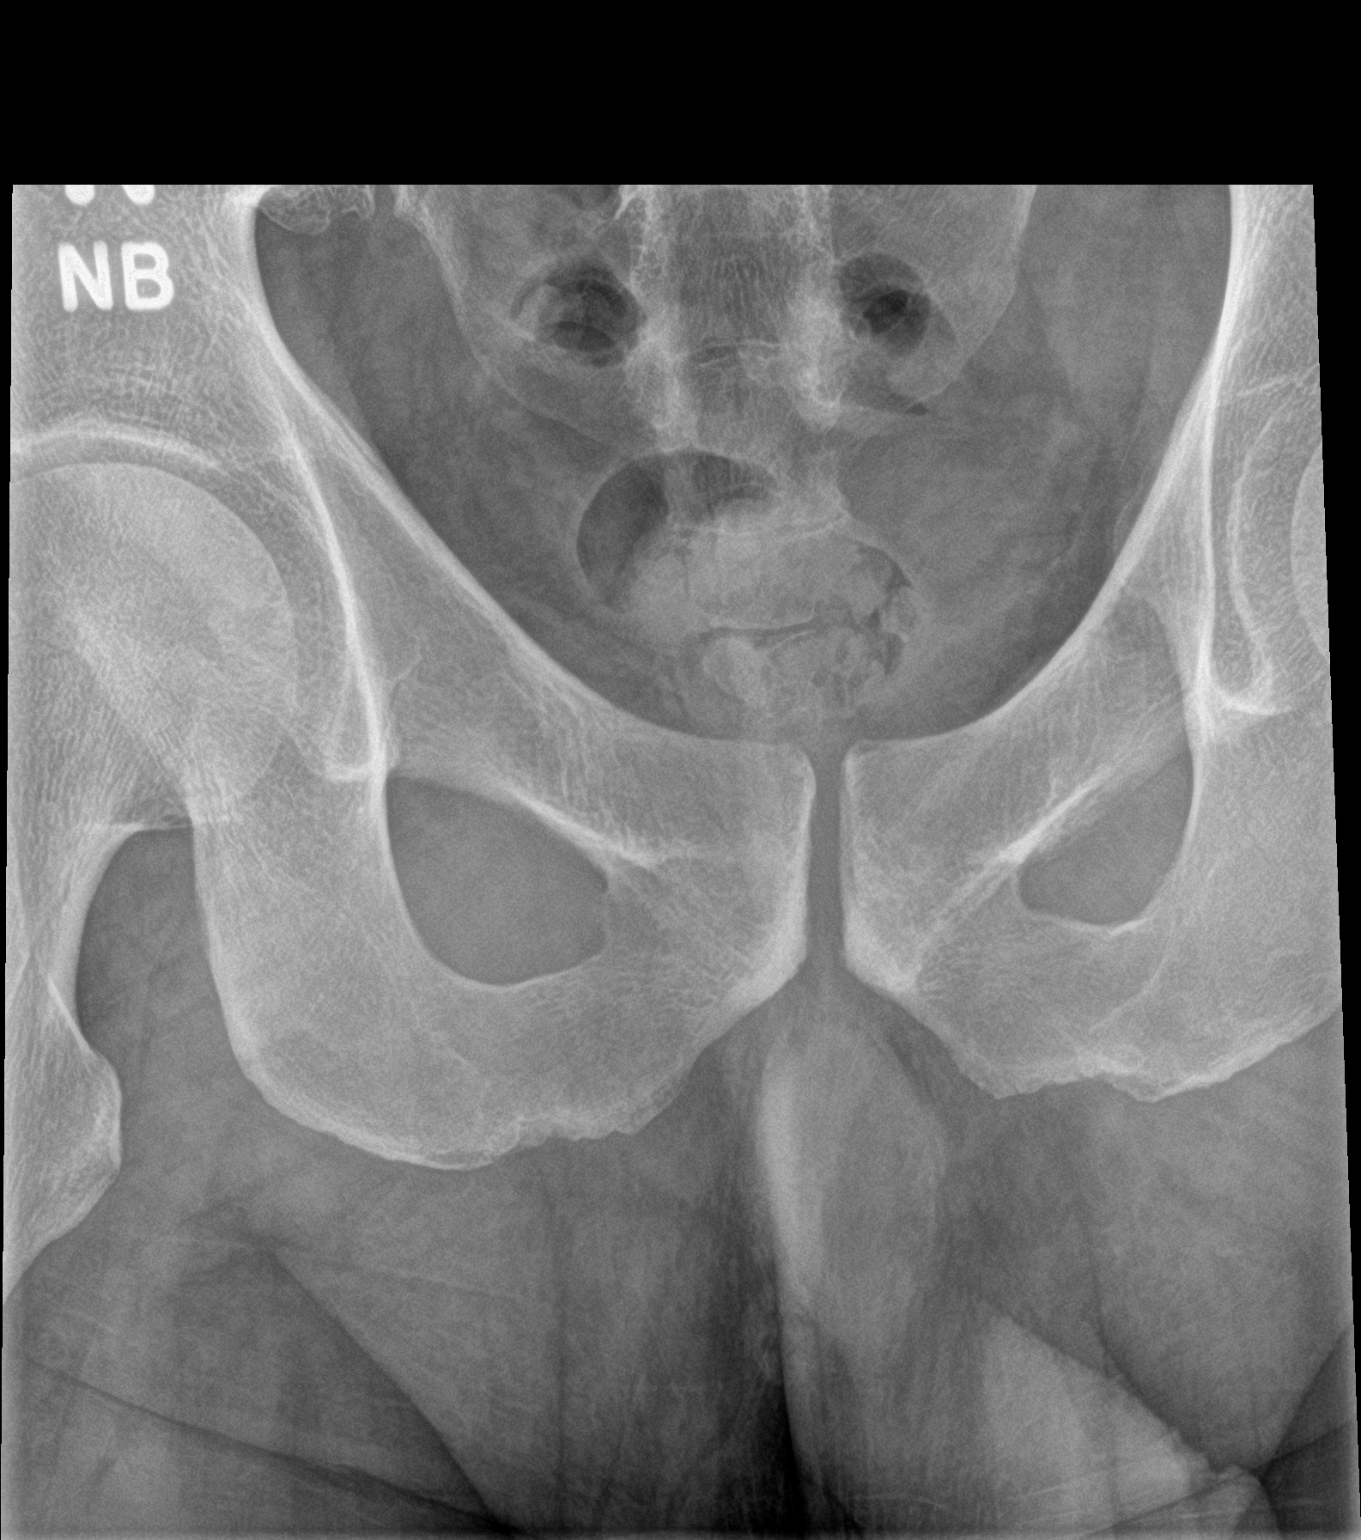

[sacrum ap]
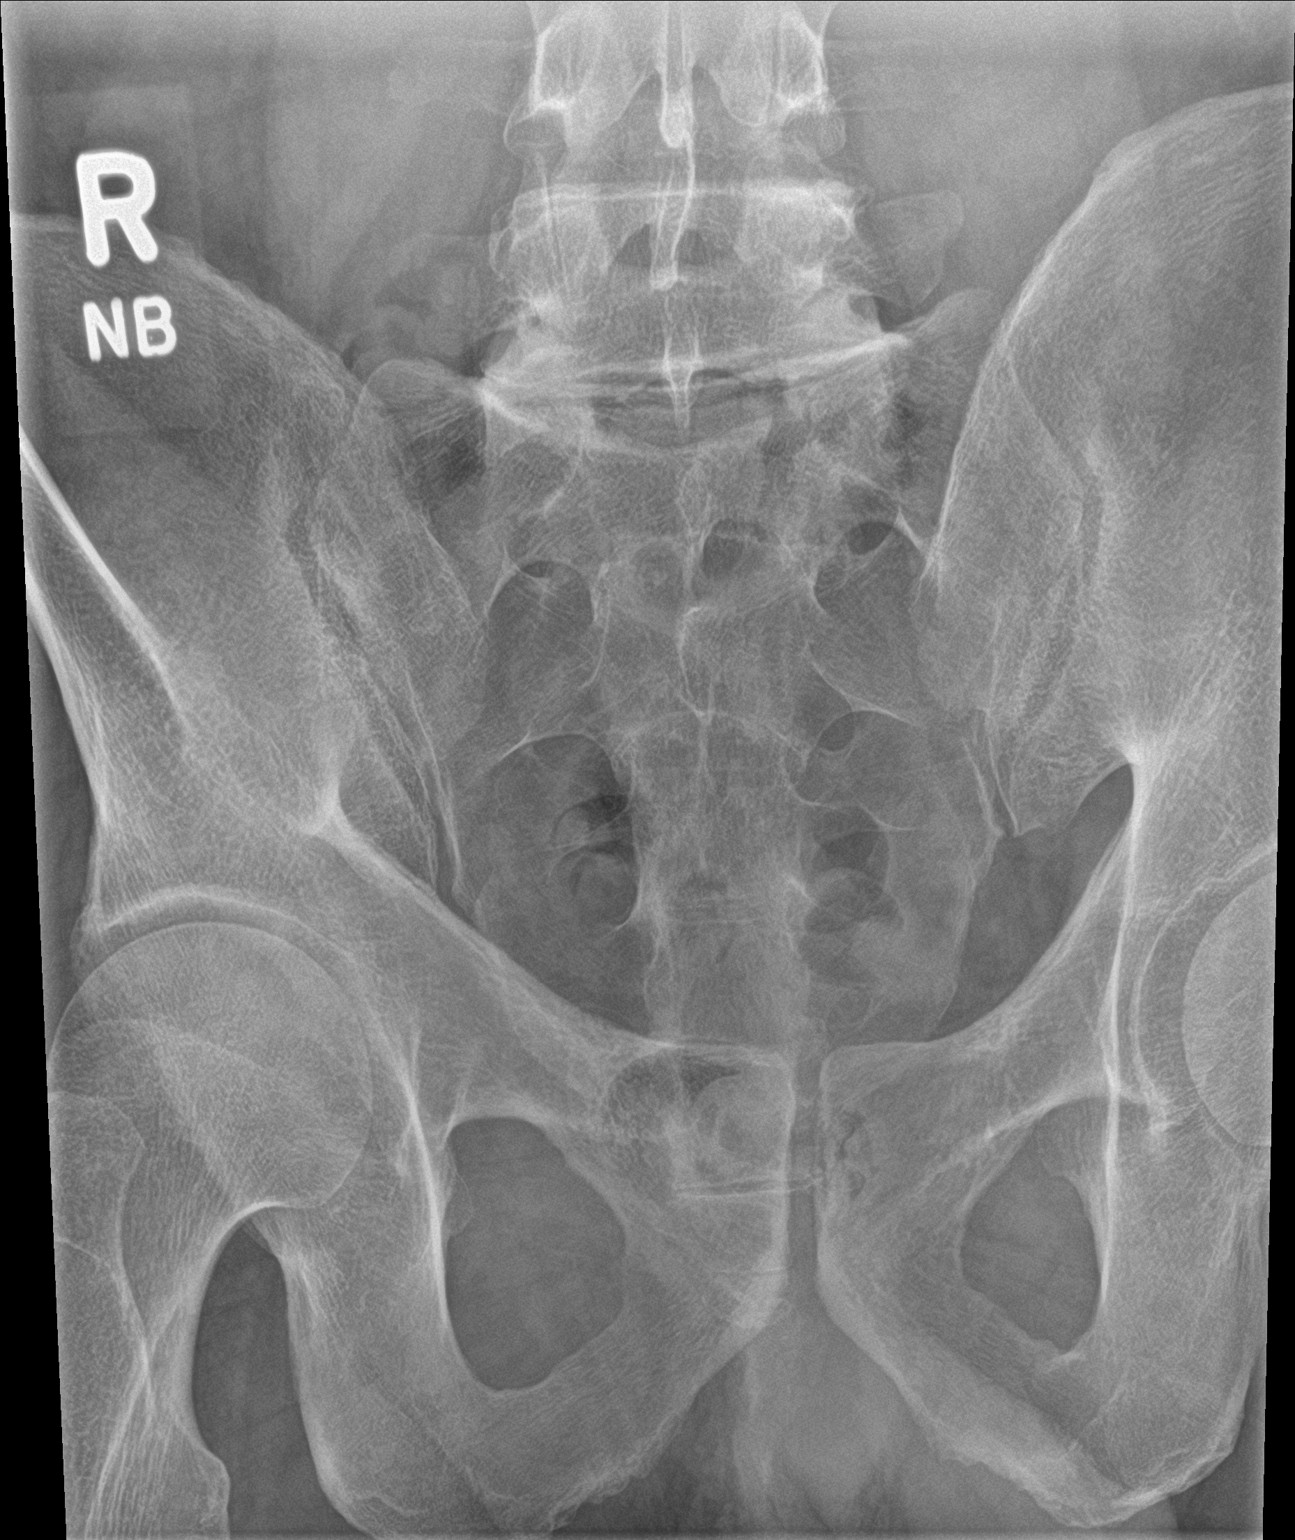

[sacrum lat]
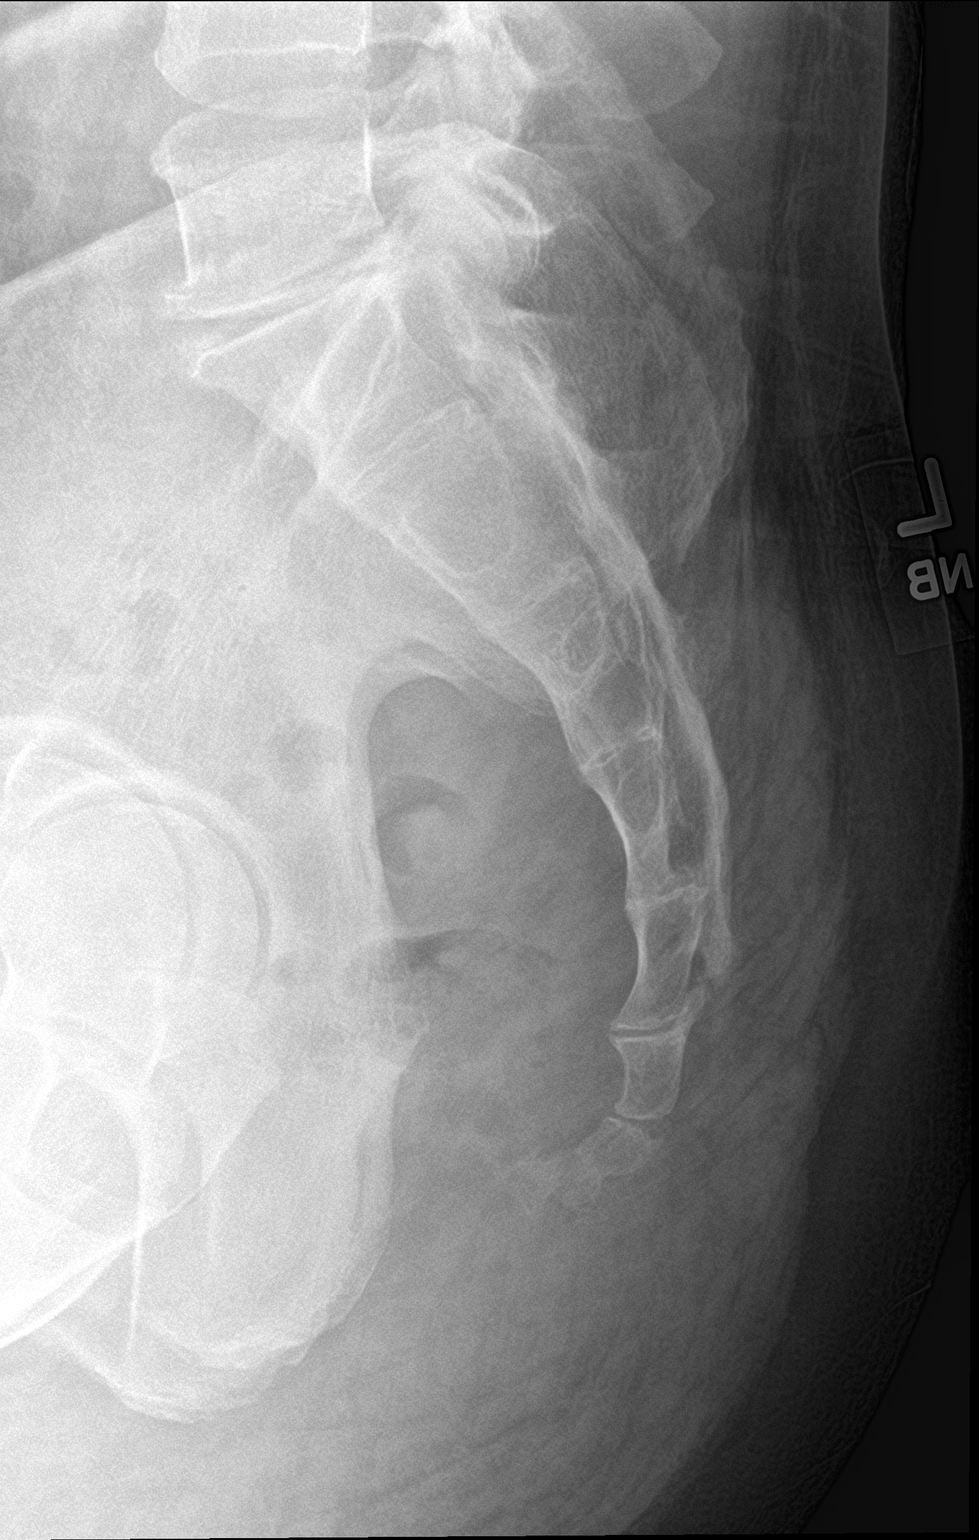

[3 of 3 positions shown; findings below may reference images not displayed]

FINDINGS: No acute soft tissue bony abnormality identified. A subtle
nondisplaced fracture of the distal coccygeal tip cannot be
excluded. Degenerative changes lower lumbar spine.
IMPRESSION: A subtle nondisplaced fracture of the distal coccygeal tip cannot be
excluded. Degenerative changes lower lumbar spine.

## 2023-07-12 DIAGNOSIS — D123 Benign neoplasm of transverse colon: Secondary | ICD-10-CM | POA: Diagnosis not present

## 2023-07-12 DIAGNOSIS — D124 Benign neoplasm of descending colon: Secondary | ICD-10-CM | POA: Diagnosis not present

## 2023-07-12 DIAGNOSIS — Z1211 Encounter for screening for malignant neoplasm of colon: Secondary | ICD-10-CM | POA: Diagnosis not present

## 2023-07-12 DIAGNOSIS — K5730 Diverticulosis of large intestine without perforation or abscess without bleeding: Secondary | ICD-10-CM | POA: Diagnosis not present

## 2023-07-12 DIAGNOSIS — K635 Polyp of colon: Secondary | ICD-10-CM | POA: Diagnosis not present

## 2023-08-30 DIAGNOSIS — B9689 Other specified bacterial agents as the cause of diseases classified elsewhere: Secondary | ICD-10-CM | POA: Diagnosis not present

## 2023-08-30 DIAGNOSIS — J208 Acute bronchitis due to other specified organisms: Secondary | ICD-10-CM | POA: Diagnosis not present

## 2024-01-31 ENCOUNTER — Encounter: Admitting: Family Medicine

## 2024-02-26 ENCOUNTER — Encounter: Payer: Self-pay | Admitting: Sports Medicine

## 2024-03-05 ENCOUNTER — Encounter: Payer: Self-pay | Admitting: Family Medicine

## 2024-03-05 ENCOUNTER — Ambulatory Visit (INDEPENDENT_AMBULATORY_CARE_PROVIDER_SITE_OTHER): Admitting: Family Medicine

## 2024-03-05 VITALS — BP 128/78 | HR 61 | Ht 74.11 in | Wt 253.0 lb

## 2024-03-05 DIAGNOSIS — Z820 Family history of epilepsy and other diseases of the nervous system: Secondary | ICD-10-CM | POA: Diagnosis not present

## 2024-03-05 DIAGNOSIS — Z0000 Encounter for general adult medical examination without abnormal findings: Secondary | ICD-10-CM | POA: Diagnosis not present

## 2024-03-05 DIAGNOSIS — E781 Pure hyperglyceridemia: Secondary | ICD-10-CM | POA: Diagnosis not present

## 2024-03-05 DIAGNOSIS — N139 Obstructive and reflux uropathy, unspecified: Secondary | ICD-10-CM

## 2024-03-05 NOTE — Assessment & Plan Note (Signed)
 Well adult Orders Placed This Encounter  Procedures   PSA   CMP14+EGFR   CBC with Differential/Platelet   Lipid panel   APOE Alzheimer's Risk  Screening: Per lab orders Immunizations:  declines flu vaccine. Anticipatory guidance/Risk factor reduction:  Recommendations per AVS.

## 2024-03-05 NOTE — Patient Instructions (Signed)

## 2024-03-05 NOTE — Progress Notes (Signed)
 Michael Callahan - 53 y.o. male MRN [id number]  Date of birth: [birth year]  Subjective No chief complaint on file.   HPI Michael Callahan is a 53 y.o. male here today for annual exam.   He has has several family members affected by Alzheimer's including his mother.  He would like to discuss genetic testing.  He continues to stay quite active playing tennis and pickle ball.  He feels that his diet is pretty good.   He has cut out EtOH. He doesn't use tobacco products.   He does have regular dental care.   Review of Systems  Constitutional:  Negative for chills, fever, malaise/fatigue and weight loss.  HENT:  Negative for congestion, ear pain and sore throat.   Eyes:  Negative for blurred vision, double vision and pain.  Respiratory:  Negative for cough and shortness of breath.   Cardiovascular:  Negative for chest pain and palpitations.  Gastrointestinal:  Negative for abdominal pain, blood in stool, constipation, heartburn and nausea.  Genitourinary:  Negative for dysuria and urgency.  Musculoskeletal:  Negative for joint pain and myalgias.  Neurological:  Negative for dizziness and headaches.  Endo/Heme/Allergies:  Does not bruise/bleed easily.  Psychiatric/Behavioral:  Negative for depression. The patient is not nervous/anxious and does not have insomnia.       Allergies  Allergen Reactions   Doxycycline  Nausea And Vomiting   Other Other (See Comments)    Band-aids   Penicillins Rash    Past Medical History:  Diagnosis Date   Bilateral shoulder pain 12/05/2016   Borderline hyperlipidemia 02/08/2017   Hyperglycemia 12/02/2014   A1c normal June 2016    Prepatellar bursitis, right knee 09/04/2016   Primary osteoarthritis of both hands 03/21/2016   Seasonal allergies 03/24/2013    Past Surgical History:  Procedure Laterality Date   HEAD & NECK SKIN LESION EXCISIONAL BIOPSY      Social History   Socioeconomic History   Marital status: Married    Spouse name: Not on file    Number of children: Not on file   Years of education: Not on file   Highest education level: Not on file  Occupational History   Not on file  Tobacco Use   Smoking status: Never   Smokeless tobacco: Never  Vaping Use   Vaping status: Never Used  Substance and Sexual Activity   Alcohol use: Yes    Comment: <3 drinks per month   Drug use: No   Sexual activity: Yes    Partners: Female    Birth control/protection: None  Other Topics Concern   Not on file  Social History Narrative   Not on file   Social Drivers of Health   Financial Resource Strain: Not on file  Food Insecurity: Not on file  Transportation Needs: Not on file  Physical Activity: Not on file  Stress: Not on file  Social Connections: Unknown (11/05/2021)   Received from [facility]   Social Network    Social Network: Not on file    Family History  Problem Relation Age of Onset   Alcoholism Father        grandfather   Brain cancer Other        uncle   Stroke Mother    Dementia Mother    Prostate cancer Maternal Grandfather     Health Maintenance  Topic Date Due   HIV Screening  Never done   Hepatitis C Screening  Never done   Hepatitis B Vaccines 19-59  Average Risk (1 of 3 - 19+ 3-dose series) Never done   Pneumococcal Vaccine: 50+ Years (1 of 1 - PCV) Never done   Influenza Vaccine  09/23/2024 (Originally 01/25/2024)   COVID-19 Vaccine (1 - 2024-25 season) 03/21/2025 (Originally 02/25/2024)   DTaP/Tdap/Td (2 - Td or Tdap) 10/25/2024   Colonoscopy  07/11/2033   Zoster Vaccines- Shingrix  Completed   HPV VACCINES  Aged Out   Meningococcal B Vaccine  Aged Out   Fecal DNA (Cologuard)  Discontinued     ----------------------------------------------------------------------------------------------------------------------------------------------------------------------------------------------------------------- Physical Exam BP 128/78 (BP Location: Left Arm, Patient Position: Sitting, Cuff Size: Large)    Pulse 61   Ht 6' 2.11 (1.882 m)   Wt 253 lb (114.8 kg)   SpO2 96%   BMI 32.39 kg/m   Physical Exam Constitutional:      General: He is not in acute distress. HENT:     Head: Normocephalic and atraumatic.     Right Ear: Tympanic membrane and external ear normal.     Left Ear: Tympanic membrane and external ear normal.  Eyes:     General: No scleral icterus. Neck:     Thyroid: No thyromegaly.  Cardiovascular:     Rate and Rhythm: Normal rate and regular rhythm.     Heart sounds: Normal heart sounds.  Pulmonary:     Effort: Pulmonary effort is normal.     Breath sounds: Normal breath sounds.  Abdominal:     General: Bowel sounds are normal. There is no distension.     Palpations: Abdomen is soft.     Tenderness: There is no abdominal tenderness. There is no guarding.  Musculoskeletal:     Cervical back: Normal range of motion.  Lymphadenopathy:     Cervical: No cervical adenopathy.  Skin:    General: Skin is warm and dry.     Findings: No rash.  Neurological:     Mental Status: He is alert and oriented to person, place, and time.     Cranial Nerves: No cranial nerve deficit.     Motor: No abnormal muscle tone.  Psychiatric:        Mood and Affect: Mood normal.        Behavior: Behavior normal.     ------------------------------------------------------------------------------------------------------------------------------------------------------------------------------------------------------------------- Assessment and Plan  Well adult exam Well adult Orders Placed This Encounter  Procedures   PSA   CMP14+EGFR   CBC with Differential/Platelet   Lipid panel   APOE Alzheimer's Risk  Screening: Per lab orders Immunizations:  declines flu vaccine. Anticipatory guidance/Risk factor reduction:  Recommendations per AVS.    Family history of Alzheimer disease Checking ApoE.     No orders of the defined types were placed in this encounter.   No follow-ups  on file.

## 2024-03-05 NOTE — Assessment & Plan Note (Signed)
 Checking ApoE.

## 2024-03-07 ENCOUNTER — Other Ambulatory Visit: Payer: Self-pay | Admitting: Medical Genetics

## 2024-03-15 LAB — CMP14+EGFR
ALT: 27 IU/L (ref 0–44)
AST: 23 IU/L (ref 0–40)
Albumin: 4.5 g/dL (ref 3.8–4.9)
Alkaline Phosphatase: 70 IU/L (ref 44–121)
BUN/Creatinine Ratio: 13 (ref 9–20)
BUN: 14 mg/dL (ref 6–24)
Bilirubin Total: 0.4 mg/dL (ref 0.0–1.2)
CO2: 22 mmol/L (ref 20–29)
Calcium: 9 mg/dL (ref 8.7–10.2)
Chloride: 104 mmol/L (ref 96–106)
Creatinine, Ser: 1.11 mg/dL (ref 0.76–1.27)
Globulin, Total: 2.1 g/dL (ref 1.5–4.5)
Glucose: 101 mg/dL — ABNORMAL HIGH (ref 70–99)
Potassium: 4.7 mmol/L (ref 3.5–5.2)
Sodium: 139 mmol/L (ref 134–144)
Total Protein: 6.6 g/dL (ref 6.0–8.5)
eGFR: 78 mL/min/1.73 (ref >59)

## 2024-03-15 LAB — CBC WITH DIFFERENTIAL/PLATELET
Basophils Absolute: 0.1 x10E3/uL (ref 0.0–0.2)
Basos: 1 %
EOS (ABSOLUTE): 0.1 x10E3/uL (ref 0.0–0.4)
Eos: 3 %
Hematocrit: 45.2 % (ref 37.5–51.0)
Hemoglobin: 15.3 g/dL (ref 13.0–17.7)
Immature Grans (Abs): 0 x10E3/uL (ref 0.0–0.1)
Immature Granulocytes: 0 %
Lymphocytes Absolute: 1.5 x10E3/uL (ref 0.7–3.1)
Lymphs: 29 %
MCH: 33.2 pg — ABNORMAL HIGH (ref 26.6–33.0)
MCHC: 33.8 g/dL (ref 31.5–35.7)
MCV: 98 fL — ABNORMAL HIGH (ref 79–97)
Monocytes Absolute: 0.4 x10E3/uL (ref 0.1–0.9)
Monocytes: 8 %
Neutrophils Absolute: 3.2 x10E3/uL (ref 1.4–7.0)
Neutrophils: 59 %
Platelets: 219 x10E3/uL (ref 150–450)
RBC: 4.61 x10E6/uL (ref 4.14–5.80)
RDW: 12.2 % (ref 11.6–15.4)
WBC: 5.4 x10E3/uL (ref 3.4–10.8)

## 2024-03-15 LAB — LIPID PANEL
Chol/HDL Ratio: 4.2 ratio (ref 0.0–5.0)
Cholesterol, Total: 216 mg/dL — ABNORMAL HIGH (ref 100–199)
HDL: 51 mg/dL (ref >39)
LDL Chol Calc (NIH): 140 mg/dL — ABNORMAL HIGH (ref 0–99)
Triglycerides: 141 mg/dL (ref 0–149)
VLDL Cholesterol Cal: 25 mg/dL (ref 5–40)

## 2024-03-15 LAB — APOE ALZHEIMER'S RISK

## 2024-03-15 LAB — PSA: Prostate Specific Ag, Serum: 1.8 ng/mL (ref 0.0–4.0)

## 2024-03-21 ENCOUNTER — Ambulatory Visit: Payer: Self-pay | Admitting: Family Medicine

## 2024-05-01 ENCOUNTER — Ambulatory Visit (INDEPENDENT_AMBULATORY_CARE_PROVIDER_SITE_OTHER): Admitting: Student

## 2024-05-01 ENCOUNTER — Ambulatory Visit (INDEPENDENT_AMBULATORY_CARE_PROVIDER_SITE_OTHER)

## 2024-05-01 ENCOUNTER — Other Ambulatory Visit (HOSPITAL_BASED_OUTPATIENT_CLINIC_OR_DEPARTMENT_OTHER): Payer: Self-pay

## 2024-05-01 DIAGNOSIS — M5137 Other intervertebral disc degeneration, lumbosacral region with discogenic back pain only: Secondary | ICD-10-CM | POA: Diagnosis not present

## 2024-05-01 DIAGNOSIS — M545 Low back pain, unspecified: Secondary | ICD-10-CM

## 2024-05-01 DIAGNOSIS — M47817 Spondylosis without myelopathy or radiculopathy, lumbosacral region: Secondary | ICD-10-CM | POA: Diagnosis not present

## 2024-05-01 DIAGNOSIS — S39012A Strain of muscle, fascia and tendon of lower back, initial encounter: Secondary | ICD-10-CM

## 2024-05-01 DIAGNOSIS — M47814 Spondylosis without myelopathy or radiculopathy, thoracic region: Secondary | ICD-10-CM | POA: Diagnosis not present

## 2024-05-01 MED ORDER — METHOCARBAMOL 500 MG PO TABS
500.0000 mg | ORAL_TABLET | Freq: Four times a day (QID) | ORAL | 0 refills | Status: AC
  Filled 2024-05-01: qty 56, 14d supply, fill #0

## 2024-05-01 MED ORDER — METHYLPREDNISOLONE 4 MG PO TBPK
ORAL_TABLET | ORAL | 0 refills | Status: AC
  Filled 2024-05-01: qty 21, 6d supply, fill #0

## 2024-05-01 NOTE — Progress Notes (Signed)
 Chief Complaint: Left-sided low back pain    Discussed the use of AI scribe software for clinical note transcription with the patient, who gave verbal consent to proceed.  History of Present Illness Michael Callahan is a 53 year old male who presents with acute left-sided lower back pain.  He experienced a 'popping' sensation on the left side above his buttock after bending over to pick up a box yesterday, followed by intense pain that has progressively worsened. The pain is severe, worsens with movement, and is associated with muscle spasms that limit his walking. He has difficulty sleeping due to the pain, with some relief when lying on his back. He applied a lidocaine patch and took Advil without significant relief. The pain is localized to the left side and does not radiate down the leg. There is no numbness, tingling, or weakness in the legs. He is active in sports such as basketball, pickleball, golf, and tennis. He is concerned about his ability to participate in an upcoming golf tournament on November 16th.   Surgical History:   None  PMH/PSH/Family History/Social History/Meds/Allergies:    Past Medical History:  Diagnosis Date   Bilateral shoulder pain 12/05/2016   Borderline hyperlipidemia 02/08/2017   Hyperglycemia 12/02/2014   A1c normal June 2016    Prepatellar bursitis, right knee 09/04/2016   Primary osteoarthritis of both hands 03/21/2016   Seasonal allergies 03/24/2013   Past Surgical History:  Procedure Laterality Date   HEAD & NECK SKIN LESION EXCISIONAL BIOPSY     Social History   Socioeconomic History   Marital status: Married    Spouse name: Not on file   Number of children: Not on file   Years of education: Not on file   Highest education level: Not on file  Occupational History   Not on file  Tobacco Use   Smoking status: Never   Smokeless tobacco: Never  Vaping Use   Vaping status: Never Used  Substance and Sexual  Activity   Alcohol use: Yes    Comment: <3 drinks per month   Drug use: No   Sexual activity: Yes    Partners: Female    Birth control/protection: None  Other Topics Concern   Not on file  Social History Narrative   Not on file   Social Drivers of Health   Financial Resource Strain: Not on file  Food Insecurity: Not on file  Transportation Needs: Not on file  Physical Activity: Not on file  Stress: Not on file  Social Connections: Unknown (11/05/2021)   Received from [employer]   Social Network    Social Network: Not on file   Family History  Problem Relation Age of Onset   Alcoholism Father        grandfather   Brain cancer Other        uncle   Stroke Mother    Dementia Mother    Prostate cancer Maternal Grandfather    Allergies  Allergen Reactions   Doxycycline  Nausea And Vomiting   Other Other (See Comments)    Band-aids   Penicillins Rash   Current Outpatient Medications  Medication Sig Dispense Refill   methocarbamol (ROBAXIN) 500 MG tablet Take 1 tablet (500 mg total) by mouth 4 (four) times daily for 14 days. 56 tablet 0   methylPREDNISolone (  MEDROL DOSEPAK) 4 MG TBPK tablet Take per packet instructions 1 each 0   fluticasone  (FLONASE ) 50 MCG/ACT nasal spray Place 2 sprays into both nostrils daily. 16 g 0   sulfamethoxazole -trimethoprim  (BACTRIM  DS) 800-160 MG tablet Take 1 tablet by mouth 2 (two) times daily. 28 tablet 0   tamsulosin  (FLOMAX ) 0.4 MG CAPS capsule Take 1 capsule (0.4 mg total) by mouth daily. 30 capsule 0   No current facility-administered medications for this visit.   No results found.  Review of Systems:   A ROS was performed including pertinent positives and negatives as documented in the HPI.  Physical Exam :   Constitutional: NAD and appears stated age Neurological: Alert and oriented Psych: Appropriate affect and cooperative There were no vitals taken for this visit.   Comprehensive Musculoskeletal Exam:    Tenderness with  palpation directly over the lower left lumbar paraspinal musculature.  No midline tenderness present.  Discomfort noted particularly with lumbar flexion and extension.  Greater trochanter nontender.  No lower extremity weakness is noted.  Imaging:   Xray (lumbar spine 4 views): Normal lordotic alignment of the lumbar spine with mild L4-L5 disc space narrowing and more significant narrowing at L5-S1 with anterior osteophyte formation.   I personally reviewed and interpreted the radiographs.      Assessment & Plan Acute left-sided low back pain with muscle spasm   Severe left-sided low back pain with muscle spasm occurred after lifting, indicating muscular strain without nerve involvement or radicular symptoms. X-rays reveal L5-S1 space loss and bone spurs. Prescribe Medrol Dosepak and Robaxin for symptom relief.  Can also trial lidocaine patches, muscle rubs, Biofreeze, or Icy Hot for topical relief. Use ice for 48 hours, then switch to heat. Encourage continued movement. Follow up if symptoms persist or worsen, or if radiating symptoms develop.      I personally saw and evaluated the patient, and participated in the management and treatment plan.  Leonce Reveal, PA-C Orthopedics

## 2024-05-28 ENCOUNTER — Other Ambulatory Visit (HOSPITAL_COMMUNITY)
Admission: RE | Admit: 2024-05-28 | Discharge: 2024-05-28 | Disposition: A | Payer: Self-pay | Source: Ambulatory Visit | Attending: Medical Genetics | Admitting: Medical Genetics

## 2024-06-07 LAB — GENECONNECT MOLECULAR SCREEN: Genetic Analysis Overall Interpretation: NEGATIVE
